# Patient Record
Sex: Male | Born: 1980 | Race: Black or African American | Hispanic: No | Marital: Single | Smoking: Never smoker
Health system: Southern US, Community
[De-identification: ages and names within clinical notes are randomized; demographics above are authoritative.]

## PROBLEM LIST (undated history)

## (undated) HISTORY — PX: ANTERIOR CRUCIATE LIGAMENT REPAIR: SHX115

---
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
## 2002-04-01 ENCOUNTER — Emergency Department (HOSPITAL_COMMUNITY): Admission: EM | Admit: 2002-04-01 | Discharge: 2002-04-02 | Payer: Self-pay | Admitting: Emergency Medicine

## 2002-07-06 ENCOUNTER — Emergency Department (HOSPITAL_COMMUNITY): Admission: EM | Admit: 2002-07-06 | Discharge: 2002-07-06 | Payer: Self-pay | Admitting: Emergency Medicine

## 2002-07-06 ENCOUNTER — Encounter: Payer: Self-pay | Admitting: Emergency Medicine

## 2003-03-11 ENCOUNTER — Emergency Department (HOSPITAL_COMMUNITY): Admission: EM | Admit: 2003-03-11 | Discharge: 2003-03-11 | Payer: Self-pay | Admitting: Emergency Medicine

## 2003-04-11 ENCOUNTER — Emergency Department (HOSPITAL_COMMUNITY): Admission: EM | Admit: 2003-04-11 | Discharge: 2003-04-11 | Payer: Self-pay | Admitting: Emergency Medicine

## 2003-04-29 ENCOUNTER — Emergency Department (HOSPITAL_COMMUNITY): Admission: EM | Admit: 2003-04-29 | Discharge: 2003-04-29 | Payer: Self-pay | Admitting: Emergency Medicine

## 2003-10-31 ENCOUNTER — Emergency Department (HOSPITAL_COMMUNITY): Admission: EM | Admit: 2003-10-31 | Discharge: 2003-10-31 | Payer: Self-pay | Admitting: Emergency Medicine

## 2006-08-23 ENCOUNTER — Emergency Department (HOSPITAL_COMMUNITY): Admission: EM | Admit: 2006-08-23 | Discharge: 2006-08-23 | Payer: Self-pay | Admitting: Emergency Medicine

## 2010-05-21 ENCOUNTER — Emergency Department (HOSPITAL_COMMUNITY)
Admission: EM | Admit: 2010-05-21 | Discharge: 2010-05-21 | Payer: Self-pay | Source: Home / Self Care | Admitting: Emergency Medicine

## 2010-05-21 LAB — RAPID STREP SCREEN (MED CTR MEBANE ONLY): Streptococcus, Group A Screen (Direct): NEGATIVE

## 2010-05-22 LAB — STREP A DNA PROBE

## 2010-09-06 ENCOUNTER — Emergency Department (HOSPITAL_COMMUNITY)
Admission: EM | Admit: 2010-09-06 | Discharge: 2010-09-06 | Disposition: A | Discharge status: Home / Self Care | Payer: Managed Care, Other (non HMO) | Attending: Emergency Medicine | Admitting: Emergency Medicine

## 2010-09-06 DIAGNOSIS — R05 Cough: Secondary | ICD-10-CM | POA: Insufficient documentation

## 2010-09-06 DIAGNOSIS — R059 Cough, unspecified: Secondary | ICD-10-CM | POA: Insufficient documentation

## 2010-09-06 DIAGNOSIS — R509 Fever, unspecified: Secondary | ICD-10-CM | POA: Insufficient documentation

## 2010-09-06 DIAGNOSIS — J029 Acute pharyngitis, unspecified: Secondary | ICD-10-CM | POA: Insufficient documentation

## 2010-09-10 ENCOUNTER — Emergency Department (HOSPITAL_COMMUNITY)
Admission: EM | Admit: 2010-09-10 | Discharge: 2010-09-10 | Disposition: A | Discharge status: Home / Self Care | Payer: Managed Care, Other (non HMO) | Attending: Emergency Medicine | Admitting: Emergency Medicine

## 2010-09-10 DIAGNOSIS — R509 Fever, unspecified: Secondary | ICD-10-CM | POA: Insufficient documentation

## 2010-09-10 DIAGNOSIS — R599 Enlarged lymph nodes, unspecified: Secondary | ICD-10-CM | POA: Insufficient documentation

## 2010-09-10 DIAGNOSIS — J039 Acute tonsillitis, unspecified: Secondary | ICD-10-CM | POA: Insufficient documentation

## 2010-09-21 ENCOUNTER — Emergency Department (HOSPITAL_COMMUNITY)
Admission: EM | Admit: 2010-09-21 | Discharge: 2010-09-21 | Disposition: A | Discharge status: Home / Self Care | Payer: Managed Care, Other (non HMO) | Attending: Emergency Medicine | Admitting: Emergency Medicine

## 2010-09-21 DIAGNOSIS — L03317 Cellulitis of buttock: Secondary | ICD-10-CM | POA: Insufficient documentation

## 2010-09-21 DIAGNOSIS — L0231 Cutaneous abscess of buttock: Secondary | ICD-10-CM | POA: Insufficient documentation

## 2010-09-23 ENCOUNTER — Emergency Department (HOSPITAL_COMMUNITY)
Admission: EM | Admit: 2010-09-23 | Discharge: 2010-09-23 | Disposition: A | Discharge status: Home / Self Care | Payer: Managed Care, Other (non HMO) | Attending: Emergency Medicine | Admitting: Emergency Medicine

## 2010-09-23 DIAGNOSIS — L0231 Cutaneous abscess of buttock: Secondary | ICD-10-CM | POA: Insufficient documentation

## 2010-09-23 DIAGNOSIS — L03317 Cellulitis of buttock: Secondary | ICD-10-CM | POA: Insufficient documentation

## 2010-09-23 DIAGNOSIS — Z09 Encounter for follow-up examination after completed treatment for conditions other than malignant neoplasm: Secondary | ICD-10-CM | POA: Insufficient documentation

## 2010-12-25 ENCOUNTER — Inpatient Hospital Stay (INDEPENDENT_AMBULATORY_CARE_PROVIDER_SITE_OTHER)
Admission: RE | Admit: 2010-12-25 | Discharge: 2010-12-25 | Disposition: A | Discharge status: Home / Self Care | Payer: Managed Care, Other (non HMO) | Source: Ambulatory Visit | Attending: Emergency Medicine | Admitting: Emergency Medicine

## 2010-12-25 DIAGNOSIS — J069 Acute upper respiratory infection, unspecified: Secondary | ICD-10-CM

## 2010-12-25 DIAGNOSIS — J029 Acute pharyngitis, unspecified: Secondary | ICD-10-CM

## 2011-03-21 ENCOUNTER — Ambulatory Visit
Admission: RE | Admit: 2011-03-21 | Discharge: 2011-03-21 | Disposition: A | Discharge status: Home / Self Care | Payer: Managed Care, Other (non HMO) | Source: Ambulatory Visit | Attending: Family Medicine | Admitting: Family Medicine

## 2011-03-21 ENCOUNTER — Other Ambulatory Visit: Payer: Self-pay | Admitting: Family Medicine

## 2011-03-21 DIAGNOSIS — R52 Pain, unspecified: Secondary | ICD-10-CM

## 2011-03-26 ENCOUNTER — Encounter: Payer: Self-pay | Admitting: *Deleted

## 2011-03-26 ENCOUNTER — Emergency Department (INDEPENDENT_AMBULATORY_CARE_PROVIDER_SITE_OTHER)
Admission: EM | Admit: 2011-03-26 | Discharge: 2011-03-26 | Disposition: A | Discharge status: Home / Self Care | Payer: Managed Care, Other (non HMO) | Source: Home / Self Care | Attending: Emergency Medicine | Admitting: Emergency Medicine

## 2011-03-26 DIAGNOSIS — J358 Other chronic diseases of tonsils and adenoids: Secondary | ICD-10-CM

## 2011-03-26 LAB — POCT RAPID STREP A: Streptococcus, Group A Screen (Direct): NEGATIVE

## 2011-03-26 MED ORDER — AMOXICILLIN 500 MG PO CAPS: 500.0000 mg | ORAL_CAPSULE | Freq: Three times a day (TID) | ORAL | Status: AC

## 2011-03-26 MED ORDER — GUAIFENESIN-CODEINE 100-10 MG/5ML PO SYRP: 5.0000 mL | ORAL_SOLUTION | Freq: Three times a day (TID) | ORAL | Status: AC | PRN

## 2011-03-26 NOTE — ED Provider Notes (Signed)
History     CSN: [id number] Arrival date & time: 03/26/2011 12:12 PM   First MD Initiated Contact with Patient 03/26/11 1139      Chief Complaint  Patient presents with  . Sore Throat    (Consider location/radiation/quality/duration/timing/severity/associated sxs/prior treatment) HPI Comments: Having a sore throat x 2 days, hurst to swallow: and my R ear hurts  Patient is a 30 y.o. male presenting with pharyngitis. The history is provided by the patient.  Sore Throat This is a new problem. The current episode started 2 days ago. The problem occurs constantly. Associated symptoms include headaches. Pertinent negatives include no shortness of breath. The symptoms are aggravated by swallowing and coughing. The symptoms are relieved by nothing. He has tried nothing for the symptoms. The treatment provided no relief.    History reviewed. No pertinent past medical history.  History reviewed. No pertinent past surgical history.  Family History  Problem Relation Age of Onset  . Hypertension Mother   . Cancer Father     History  Substance Use Topics  . Smoking status: Never Smoker   . Smokeless tobacco: Not on file  . Alcohol Use: Yes     Social      Review of Systems  Respiratory: Negative for shortness of breath.   Neurological: Positive for headaches.    Allergies  Review of patient's allergies indicates no known allergies.  Home Medications   Current Outpatient Rx  Name Route Sig Dispense Refill  . AMOXICILLIN 500 MG PO CAPS Oral Take 1 capsule (500 mg total) by mouth 3 (three) times daily. X 10 days 21 capsule 0  . GUAIFENESIN-CODEINE 100-10 MG/5ML PO SYRP Oral Take 5 mLs by mouth 3 (three) times daily as needed. 120 mL 0    BP 137/89  Pulse 109  Temp(Src) 102.7 F (39.3 C) (Oral)  Resp 20  SpO2 100%  Physical Exam  Nursing note and vitals reviewed. Constitutional: He appears well-developed. No distress.  HENT:  Head: Normocephalic.  Right Ear:  Tympanic membrane and ear canal normal.  Left Ear: Tympanic membrane and ear canal normal.  Mouth/Throat: Uvula is midline and mucous membranes are normal. Oropharyngeal exudate and posterior oropharyngeal erythema present. No posterior oropharyngeal edema or tonsillar abscesses.    Eyes: Conjunctivae are normal.  Neck: Normal range of motion. No JVD present. No tracheal deviation present.  Pulmonary/Chest: Effort normal and breath sounds normal. No respiratory distress. He has no wheezes. He has no rales.  Abdominal: Soft.  Musculoskeletal: Normal range of motion.  Lymphadenopathy:    He has cervical adenopathy.  Neurological: He is alert.  Skin: Skin is warm. No erythema.    ED Course  Procedures (including critical care time)   Labs Reviewed  POCT RAPID STREP A ([facility] ONLY)   No results found.   1. Tonsillar exudate       MDM  48 hrs. Localized tonsillitis wth and exudate. No evidence of an abscess. Follow-up guided to return in 48 hours for recheck, if worsening pain or no impoveme         Jimmie Molly, MD 03/26/11 [phone]

## 2011-03-26 NOTE — ED Notes (Signed)
Pt    Reports   sorethroat    Fever     vomitng     As   Well   As  Body   Aches       Stomach  Pain  Symptoms  X  3  Days

## 2014-12-27 ENCOUNTER — Encounter (HOSPITAL_COMMUNITY): Payer: Self-pay | Admitting: Emergency Medicine

## 2014-12-27 ENCOUNTER — Emergency Department (HOSPITAL_COMMUNITY)
Admission: EM | Admit: 2014-12-27 | Discharge: 2014-12-27 | Disposition: A | Discharge status: Home / Self Care | Payer: Managed Care, Other (non HMO) | Attending: Emergency Medicine | Admitting: Emergency Medicine

## 2014-12-27 ENCOUNTER — Emergency Department (HOSPITAL_COMMUNITY): Payer: Managed Care, Other (non HMO)

## 2014-12-27 DIAGNOSIS — Y9289 Other specified places as the place of occurrence of the external cause: Secondary | ICD-10-CM | POA: Insufficient documentation

## 2014-12-27 DIAGNOSIS — W25XXXA Contact with sharp glass, initial encounter: Secondary | ICD-10-CM | POA: Insufficient documentation

## 2014-12-27 DIAGNOSIS — S61411A Laceration without foreign body of right hand, initial encounter: Secondary | ICD-10-CM | POA: Insufficient documentation

## 2014-12-27 DIAGNOSIS — Y9389 Activity, other specified: Secondary | ICD-10-CM | POA: Diagnosis not present

## 2014-12-27 DIAGNOSIS — Y998 Other external cause status: Secondary | ICD-10-CM | POA: Insufficient documentation

## 2014-12-27 DIAGNOSIS — Z23 Encounter for immunization: Secondary | ICD-10-CM | POA: Insufficient documentation

## 2014-12-27 DIAGNOSIS — S6991XA Unspecified injury of right wrist, hand and finger(s), initial encounter: Secondary | ICD-10-CM

## 2014-12-27 MED ORDER — TETANUS-DIPHTH-ACELL PERTUSSIS 5-2.5-18.5 LF-MCG/0.5 IM SUSP
0.5000 mL | Freq: Once | INTRAMUSCULAR | Status: AC
  Administered 2014-12-27: 0.5 mL via INTRAMUSCULAR
  Filled 2014-12-27: qty 0.5

## 2014-12-27 MED ORDER — ACETAMINOPHEN 500 MG PO TABS
1000.0000 mg | ORAL_TABLET | Freq: Once | ORAL | Status: AC
  Administered 2014-12-27: 1000 mg via ORAL
  Filled 2014-12-27: qty 2

## 2014-12-27 NOTE — Discharge Instructions (Signed)

## 2014-12-27 NOTE — ED Provider Notes (Signed)
CSN: [id number]     Arrival date & time 12/27/14  4098 History   First MD Initiated Contact with Patient 12/27/14 [phone]     Chief Complaint  Patient presents with  . Laceration    The patient closed a door and his hand went through a window.  He advised me that the glass went through his hand and caused a laceration to the base of the right index finger.     (Consider location/radiation/quality/duration/timing/severity/associated sxs/prior Treatment) HPI Comments: Patient presents to the emergency department with chief complaint of right hand injury. Patient states that he pushed his hand through a pane of glass this morning. He has multiple scrapes on his hand and one small laceration. He also complains 1 puncture wound on the palm of his hand. He states that he removed a shard of glass from this wound. He states that his pain is worsened with palpation and movement. He has not taken anything to alleviate his pain. He denies any difficulty moving his fingers. Bleeding is controlled. Patient is not up-to-date on his tetanus shot. He rates his pain as an 8 out of 10.  The history is provided by the patient. No language interpreter was used.    History reviewed. No pertinent past medical history. Past Surgical History  Procedure Laterality Date  . Anterior cruciate ligament repair     Family History  Problem Relation Age of Onset  . Hypertension Mother   . Cancer Father    Social History  Substance Use Topics  . Smoking status: Never Smoker   . Smokeless tobacco: None  . Alcohol Use: Yes     Comment: Social    Review of Systems  Constitutional: Negative for fever and chills.  Respiratory: Negative for shortness of breath.   Cardiovascular: Negative for chest pain.  Gastrointestinal: Negative for nausea, vomiting, diarrhea and constipation.  Genitourinary: Negative for dysuria.  Skin: Positive for wound.  All other systems reviewed and are negative.     Allergies  Review of  patient's allergies indicates no known allergies.  Home Medications   Prior to Admission medications   Not on File   BP 142/89 mmHg  Temp(Src) 98.2 F (36.8 C) (Oral)  Resp 16  SpO2 99% Physical Exam  Constitutional: He is oriented to person, place, and time. He appears well-developed and well-nourished.  HENT:  Head: Normocephalic and atraumatic.  Eyes: Conjunctivae and EOM are normal.  Neck: Normal range of motion.  Cardiovascular: Normal rate and intact distal pulses.   Brisk capillary refill  Pulmonary/Chest: Effort normal.  Abdominal: He exhibits no distension.  Musculoskeletal: Normal range of motion.  Normal range of motion and strength of all digits and right hand, all joints were individually isolated  Neurological: He is alert and oriented to person, place, and time.  Sensation intact  Skin: Skin is dry.  Small 1 cm shallow laceration over the posterior lateral aspect of the right hand between the first and second metacarpal phalangeal joints, small puncture wound on palmar aspect of hand near the second MCP, bleeding is controlled  Psychiatric: He has a normal mood and affect. His behavior is normal. Judgment and thought content normal.  Nursing note and vitals reviewed.   ED Course  Procedures (including critical care time) Labs Review Labs Reviewed - No data to display  Imaging Review No results found. I have personally reviewed and evaluated these images and lab results as part of my medical decision-making.   EKG Interpretation None  LACERATION REPAIR Performed by: Roxy Horseman Authorized by: Roxy Horseman Consent: Verbal consent obtained. Risks and benefits: risks, benefits and alternatives were discussed Consent given by: patient Patient identity confirmed: provided demographic data Prepped and Draped in normal sterile fashion Wound explored  Laceration Location: Posterior right hand  Laceration Length: 1 cm  No Foreign Bodies seen  or palpated  Anesthesia: local infiltration  Local anesthetic: Not used   Irrigation method: syringe Amount of cleaning: standard  Skin closure: Dermabond   Patient tolerance: Patient tolerated the procedure well with no immediate complications.  MDM   Final diagnoses:  Hand injury, right, initial encounter    Patient with right hand injury. Will check plain films to rule out foreign body. No evidence of tendon or ligament injury. Small laceration will be repaired with Dermabond. Tetanus shot updated.    Roxy Horseman, PA-C 12/27/14 0840  Tilden Fossa, MD 12/27/14 1024

## 2014-12-27 NOTE — ED Notes (Signed)
Patient is resting comfortably. 

## 2014-12-27 NOTE — ED Notes (Signed)
Declined W/C at D/C and was escorted to lobby by RN. 

## 2014-12-27 NOTE — ED Notes (Signed)
The patient closed a door and his hand went through a window.  He advised me that the glass went through his hand and caused a laceration to the base of the right index finger.  The patient rates his pain 8/10.

## 2015-07-07 ENCOUNTER — Other Ambulatory Visit: Payer: Self-pay | Admitting: Orthopedic Surgery

## 2015-07-07 DIAGNOSIS — M25562 Pain in left knee: Secondary | ICD-10-CM

## 2015-07-16 ENCOUNTER — Inpatient Hospital Stay: Admission: RE | Admit: 2015-07-16 | Payer: Managed Care, Other (non HMO) | Source: Ambulatory Visit

## 2017-03-25 ENCOUNTER — Encounter (HOSPITAL_COMMUNITY): Payer: Self-pay | Admitting: Emergency Medicine

## 2017-03-25 ENCOUNTER — Other Ambulatory Visit: Payer: Self-pay

## 2017-03-25 DIAGNOSIS — J181 Lobar pneumonia, unspecified organism: Secondary | ICD-10-CM | POA: Insufficient documentation

## 2017-03-25 DIAGNOSIS — J029 Acute pharyngitis, unspecified: Secondary | ICD-10-CM | POA: Diagnosis present

## 2017-03-25 LAB — COMPREHENSIVE METABOLIC PANEL
ALBUMIN: 3.4 g/dL — AB (ref 3.5–5.0)
ALT: 62 U/L (ref 17–63)
ANION GAP: 10 (ref 5–15)
AST: 99 U/L — ABNORMAL HIGH (ref 15–41)
Alkaline Phosphatase: 71 U/L (ref 38–126)
BILIRUBIN TOTAL: 0.8 mg/dL (ref 0.3–1.2)
BUN: 7 mg/dL (ref 6–20)
CO2: 27 mmol/L (ref 22–32)
Calcium: 8.6 mg/dL — ABNORMAL LOW (ref 8.9–10.3)
Chloride: 95 mmol/L — ABNORMAL LOW (ref 101–111)
Creatinine, Ser: 1.14 mg/dL (ref 0.61–1.24)
GFR calc Af Amer: >60 mL/min (ref >60)
Glucose, Bld: 131 mg/dL — ABNORMAL HIGH (ref 65–99)
POTASSIUM: 3 mmol/L — AB (ref 3.5–5.1)
Sodium: 132 mmol/L — ABNORMAL LOW (ref 135–145)
TOTAL PROTEIN: 8.4 g/dL — AB (ref 6.5–8.1)

## 2017-03-25 LAB — URINALYSIS, ROUTINE W REFLEX MICROSCOPIC
BACTERIA UA: NONE SEEN
BILIRUBIN URINE: NEGATIVE
Glucose, UA: NEGATIVE mg/dL
Ketones, ur: NEGATIVE mg/dL
LEUKOCYTES UA: NEGATIVE
NITRITE: NEGATIVE
PROTEIN: 100 mg/dL — AB
Specific Gravity, Urine: 1.016 (ref 1.005–1.030)
pH: 5 (ref 5.0–8.0)

## 2017-03-25 LAB — CBC
HEMATOCRIT: 44.7 % (ref 39.0–52.0)
HEMOGLOBIN: 15.3 g/dL (ref 13.0–17.0)
MCH: 31.3 pg (ref 26.0–34.0)
MCHC: 34.2 g/dL (ref 30.0–36.0)
MCV: 91.4 fL (ref 78.0–100.0)
Platelets: 236 10*3/uL (ref 150–400)
RBC: 4.89 MIL/uL (ref 4.22–5.81)
RDW: 13.2 % (ref 11.5–15.5)
WBC: 11.6 10*3/uL — AB (ref 4.0–10.5)

## 2017-03-25 LAB — LIPASE, BLOOD: Lipase: 22 U/L (ref 11–51)

## 2017-03-25 LAB — RAPID STREP SCREEN (MED CTR MEBANE ONLY): STREPTOCOCCUS, GROUP A SCREEN (DIRECT): NEGATIVE

## 2017-03-25 MED ORDER — ACETAMINOPHEN 325 MG PO TABS
650.0000 mg | ORAL_TABLET | Freq: Once | ORAL | Status: AC | PRN
  Administered 2017-03-26: 650 mg via ORAL
  Filled 2017-03-25 (×2): qty 2

## 2017-03-25 NOTE — ED Triage Notes (Signed)
Pt to ER for evaluation of 1 week hx of sore throat and fever with swollen, red tonsils. Pt reports 3 days ago began vomiting and 2 days ago began having CP. HR 125-130 in triage. Pt states sharp chest pains. Pt a/o x4. NAD

## 2017-03-26 ENCOUNTER — Emergency Department (HOSPITAL_COMMUNITY): Payer: Managed Care, Other (non HMO)

## 2017-03-26 ENCOUNTER — Emergency Department (HOSPITAL_COMMUNITY)
Admission: EM | Admit: 2017-03-26 | Discharge: 2017-03-26 | Disposition: A | Discharge status: Home / Self Care | Payer: Managed Care, Other (non HMO) | Attending: Emergency Medicine | Admitting: Emergency Medicine

## 2017-03-26 DIAGNOSIS — J181 Lobar pneumonia, unspecified organism: Secondary | ICD-10-CM

## 2017-03-26 DIAGNOSIS — R6889 Other general symptoms and signs: Secondary | ICD-10-CM

## 2017-03-26 DIAGNOSIS — J189 Pneumonia, unspecified organism: Secondary | ICD-10-CM

## 2017-03-26 MED ORDER — IBUPROFEN 600 MG PO TABS: 600.0000 mg | ORAL_TABLET | Freq: Four times a day (QID) | ORAL | 0 refills | Status: DC | PRN

## 2017-03-26 MED ORDER — SODIUM CHLORIDE 0.9 % IV BOLUS (SEPSIS)
1000.0000 mL | Freq: Once | INTRAVENOUS | Status: AC
  Administered 2017-03-26: 1000 mL via INTRAVENOUS

## 2017-03-26 MED ORDER — ONDANSETRON 4 MG PO TBDP: 4.0000 mg | ORAL_TABLET | Freq: Three times a day (TID) | ORAL | 0 refills | Status: DC | PRN

## 2017-03-26 MED ORDER — LEVOFLOXACIN 500 MG PO TABS: 500.0000 mg | ORAL_TABLET | Freq: Every day | ORAL | 0 refills | Status: AC

## 2017-03-26 MED ORDER — ONDANSETRON HCL 4 MG/2ML IJ SOLN
4.0000 mg | Freq: Once | INTRAMUSCULAR | Status: AC
  Administered 2017-03-26: 4 mg via INTRAVENOUS
  Filled 2017-03-26: qty 2

## 2017-03-26 MED ORDER — IBUPROFEN 400 MG PO TABS
600.0000 mg | ORAL_TABLET | Freq: Once | ORAL | Status: AC
  Administered 2017-03-26: 07:00:00 600 mg via ORAL
  Filled 2017-03-26: qty 1

## 2017-03-26 MED ORDER — LEVOFLOXACIN 500 MG PO TABS
500.0000 mg | ORAL_TABLET | Freq: Once | ORAL | Status: AC
  Administered 2017-03-26: 500 mg via ORAL
  Filled 2017-03-26: qty 1

## 2017-03-26 NOTE — ED Provider Notes (Signed)
[facility] Provider Note   CSN: [id number] Arrival date & time: 03/25/17  1841     History   Chief Complaint Chief Complaint  Patient presents with  . Sore Throat  . Chest Pain    HPI Jared Barnett is a 36 y.o. male.  HPI  This is a 36 year old male who presents with several day history of sore throat, fever, nausea, vomiting, chest pain.  Patient reports fevers at home.  States that he was seen by "Dr." and given medication.  He is unclear what this medication was.  He states "it did not help."  He reports persistent nonbloody, nonbilious emesis.  Reports crampy abdominal pain, myalgias and diarrhea.  Also reports sore throat.  Denies any headache.  States of the last 2 days he has had increasing pain in his chest.  He has had a cough.  It is nonproductive.  He is otherwise healthy.  History reviewed. No pertinent past medical history.  There are no active problems to display for this patient.   Past Surgical History:  Procedure Laterality Date  . ANTERIOR CRUCIATE LIGAMENT REPAIR         Home Medications    Prior to Admission medications   Medication Sig Start Date End Date Taking? Authorizing Provider  ibuprofen (ADVIL,MOTRIN) 600 MG tablet Take 1 tablet (600 mg total) by mouth every 6 (six) hours as needed. 03/26/17   Chino Sardo, Mayer Maskerourtney F, MD  levofloxacin (LEVAQUIN) 500 MG tablet Take 1 tablet (500 mg total) by mouth daily for 7 days. 03/26/17 04/02/17  Laiyla Slagel, Mayer Maskerourtney F, MD  ondansetron (ZOFRAN ODT) 4 MG disintegrating tablet Take 1 tablet (4 mg total) by mouth every 8 (eight) hours as needed for nausea or vomiting. 03/26/17   Kamryn Gauthier, Mayer Maskerourtney F, MD    Family History Family History  Problem Relation Age of Onset  . Hypertension Mother   . Cancer Father     Social History Social History   Tobacco Use  . Smoking status: Never Smoker  . Smokeless tobacco: Never Used  Substance Use Topics  . Alcohol use: Yes   Comment: Social  . Drug use: Yes    Frequency: 3.0 times per week     Allergies   Patient has no known allergies.   Review of Systems Review of Systems  Constitutional: Positive for chills and fever.  HENT: Positive for sore throat.   Respiratory: Positive for cough.   Cardiovascular: Positive for chest pain. Negative for leg swelling.  Gastrointestinal: Positive for diarrhea, nausea and vomiting.  Genitourinary: Negative for dysuria.  Musculoskeletal: Negative for neck stiffness.  Neurological: Negative for headaches.  All other systems reviewed and are negative.    Physical Exam Updated Vital Signs BP (!) 137/94 (BP Location: Left Arm)   Pulse 89   Temp (!) 101 F (38.3 C) (Oral)   Resp 16   SpO2 100%   Physical Exam  Constitutional: He is oriented to person, place, and time. He appears well-developed and well-nourished.  Overweight, no acute distress  HENT:  Head: Normocephalic and atraumatic.  Mouth/Throat: Uvula is midline. Oropharyngeal exudate present.  Bilateral tonsillar swelling with exudate  Eyes: Pupils are equal, round, and reactive to light.  Neck: Neck supple.  Cardiovascular: Regular rhythm and normal heart sounds.  No murmur heard. Tachycardia  Pulmonary/Chest: Effort normal and breath sounds normal. No respiratory distress. He has no wheezes.  Abdominal: Soft. Bowel sounds are normal. There is no tenderness. There is no  rebound.  Musculoskeletal: He exhibits no edema.  Lymphadenopathy:    He has no cervical adenopathy.  Neurological: He is alert and oriented to person, place, and time.  Skin: Skin is warm and dry.  Psychiatric: He has a normal mood and affect.  Nursing note and vitals reviewed.    ED Treatments / Results  Labs (all labs ordered are listed, but only abnormal results are displayed) Labs Reviewed  COMPREHENSIVE METABOLIC PANEL - Abnormal; Notable for the following components:      Result Value   Sodium 132 (*)     Potassium 3.0 (*)    Chloride 95 (*)    Glucose, Bld 131 (*)    Calcium 8.6 (*)    Total Protein 8.4 (*)    Albumin 3.4 (*)    AST 99 (*)    All other components within normal limits  CBC - Abnormal; Notable for the following components:   WBC 11.6 (*)    All other components within normal limits  URINALYSIS, ROUTINE W REFLEX MICROSCOPIC - Abnormal; Notable for the following components:   Color, Urine AMBER (*)    APPearance HAZY (*)    Hgb urine dipstick MODERATE (*)    Protein, ur 100 (*)    Squamous Epithelial / LPF 0-5 (*)    All other components within normal limits  RAPID STREP SCREEN (NOT AT [facility])  CULTURE, GROUP A STREP [facility])  LIPASE, BLOOD    EKG  EKG Interpretation  Date/Time:  Monday March 25 2017 18:59:26 EST Ventricular Rate:  125 PR Interval:  130 QRS Duration: 82 QT Interval:  298 QTC Calculation: 430 R Axis:   111 Text Interpretation:  Sinus tachycardia Left posterior fascicular block T wave abnormality, consider inferior ischemia Abnormal ECG Confirmed by Ross Marcus [id number]) on 03/26/2017 5:39:19 AM Also confirmed by Ross Marcus ([id number]), editor Elita Quick ([id number])  on 03/26/2017 7:21:14 AM       Radiology Dg Chest 2 View  Result Date: 03/26/2017 CLINICAL DATA:  Fever, upper respiratory infection symptoms for the past 4 days. Nonsmoker. EXAM: CHEST  2 VIEW COMPARISON:  Chest x-ray of October 31, 2003 FINDINGS: The lungs are adequately inflated. The lung markings are coarse in the retrocardiac region likely on the left. The heart and pulmonary vascularity are normal. The mediastinum is normal in width. There is no pleural effusion. The bony thorax is unremarkable. IMPRESSION: Subsegmental atelectasis or early pneumonia in the left lower lobe posteriorly. Follow-up radiographs are recommended if the patient's symptoms do not completely resolve following anticipated antibiotic therapy. Electronically Signed   By: David  [place] M.D.   On:  03/26/2017 07:01    Procedures Procedures (including critical care time)  Medications Ordered in ED Medications  acetaminophen (TYLENOL) tablet 650 mg (650 mg Oral Given 03/26/17 0249)  sodium chloride 0.9 % bolus 1,000 mL (1,000 mLs Intravenous New Bag/Given 03/26/17 0639)  ibuprofen (ADVIL,MOTRIN) tablet 600 mg (600 mg Oral Given 03/26/17 0641)  ondansetron (ZOFRAN) injection 4 mg (4 mg Intravenous Given 03/26/17 0641)  levofloxacin (LEVAQUIN) tablet 500 mg (500 mg Oral Given 03/26/17 0740)     Initial Impression / Assessment and Plan / ED Course  I have reviewed the triage vital signs and the nursing notes.  Pertinent labs & imaging results that were available during my care of the patient were reviewed by me and considered in my medical decision making (see chart for details).     Patient presents with several day history of flulike symptoms.  Febrile on exam.  Mildly tachycardic.  Otherwise nontoxic-appearing.  He does have oropharyngeal exudate.  No evidence of deep space infection.  Strep screen is negative.  Lab work obtained.  Mild hypokalemia and hyponatremia.  Patient given fluids, Zofran.  Abdominal exam is benign.  Suspect viral etiology.  7:45 AM X-ray shows evidence of likely early pneumonia in the left lower lobe.  Given additional systemic symptoms, would be concerned for influenza-like symptoms even though initial testing was negative at outside facility.  These tests are known to not be very sensitive.  Will treat with Levaquin for this reason given concern for flu.  Otherwise symptom control with Zofran, fluids, and ibuprofen.  Patient was given strict return precautions.  Final Clinical Impressions(s) / ED Diagnoses   Final diagnoses:  Flu-like symptoms  Community acquired pneumonia of left lower lobe of lung [facility](HCC)    ED Discharge Orders        Ordered    ondansetron (ZOFRAN ODT) 4 MG disintegrating tablet  Every 8 hours PRN     03/26/17 0744    ibuprofen  (ADVIL,MOTRIN) 600 MG tablet  Every 6 hours PRN     03/26/17 0744    levofloxacin (LEVAQUIN) 500 MG tablet  Daily     03/26/17 0744       Shon BatonHorton, Damani Rando F, MD 03/26/17 [phone]

## 2017-03-26 NOTE — ED Notes (Signed)
To x-ray

## 2017-03-26 NOTE — ED Notes (Signed)
No answer for vitals  

## 2017-03-26 NOTE — Discharge Instructions (Signed)
He was seen today for multiple upper respiratory symptoms and vomiting.  You have a flulike illness but also have what appears to be early pneumonia.  He will be treated with antibiotics.  Take Zofran as needed for vomiting.  Make sure to stay very well-hydrated.  Ibuprofen as needed for fevers.

## 2017-03-28 LAB — CULTURE, GROUP A STREP (THRC)

## 2018-06-22 ENCOUNTER — Other Ambulatory Visit: Payer: Self-pay

## 2018-06-22 ENCOUNTER — Ambulatory Visit (HOSPITAL_COMMUNITY)
Admission: EM | Admit: 2018-06-22 | Discharge: 2018-06-22 | Disposition: A | Discharge status: Home / Self Care | Payer: Managed Care, Other (non HMO) | Attending: Family Medicine | Admitting: Family Medicine

## 2018-06-22 ENCOUNTER — Encounter (HOSPITAL_COMMUNITY): Payer: Self-pay | Admitting: *Deleted

## 2018-06-22 DIAGNOSIS — J22 Unspecified acute lower respiratory infection: Secondary | ICD-10-CM

## 2018-06-22 MED ORDER — AMOXICILLIN-POT CLAVULANATE 875-125 MG PO TABS: 1.0000 | ORAL_TABLET | Freq: Two times a day (BID) | ORAL | 0 refills | Status: DC

## 2018-06-22 MED ORDER — DM-GUAIFENESIN ER 30-600 MG PO TB12: 1.0000 | ORAL_TABLET | Freq: Two times a day (BID) | ORAL | 0 refills | Status: DC

## 2018-06-22 MED ORDER — BENZONATATE 100 MG PO CAPS: 100.0000 mg | ORAL_CAPSULE | Freq: Three times a day (TID) | ORAL | 0 refills | Status: DC

## 2018-06-22 NOTE — Discharge Instructions (Addendum)
Get plenty rest Push fluids Take the antibiotic 2 times a day Take both Tessalon pills and Mucinex DM pills every 12 hours This is for the cough and congestion expect improvement in a few days

## 2018-06-22 NOTE — ED Triage Notes (Signed)
C/O cough and congestion without fever x 1 wk.

## 2018-06-22 NOTE — ED Provider Notes (Signed)
[facility]    CSN: [id number] Arrival date & time: 06/22/18  1724     History   Chief Complaint Chief Complaint  Patient presents with  . Cough    HPI Jared Barnett is a 38 y.o. male.   HPI Patient is here for cough.  He has a lot of chest congestion.  He is producing a lot of sputum.  He is very tired.  He states that he had a cough with fatigue about a year ago and was diagnosed with pneumonia. He has had current symptoms for 8 days.  He is tried multiple over-the-counter medicines without improvement.  He states he has had some sweats and chills low-grade fever.  Initially had some minor runny nose with this is gotten better.  No underlying lung disease, COPD, asthma History reviewed. No pertinent past medical history.  There are no active problems to display for this patient.   Past Surgical History:  Procedure Laterality Date  . ANTERIOR CRUCIATE LIGAMENT REPAIR         Home Medications    Prior to Admission medications   Medication Sig Start Date End Date Taking? Authorizing Provider  amoxicillin-clavulanate (AUGMENTIN) 875-125 MG tablet Take 1 tablet by mouth every 12 (twelve) hours. 06/22/18   Eustace Moore, MD  benzonatate (TESSALON) 100 MG capsule Take 1 capsule (100 mg total) by mouth every 8 (eight) hours. 06/22/18   Eustace Moore, MD  dextromethorphan-guaiFENesin [facility] DM) 30-600 MG 12hr tablet Take 1 tablet by mouth 2 (two) times daily. 06/22/18   Eustace Moore, MD    Family History Family History  Problem Relation Age of Onset  . Hypertension Mother   . Cancer Father     Social History Social History   Tobacco Use  . Smoking status: Never Smoker  . Smokeless tobacco: Never Used  Substance Use Topics  . Alcohol use: Yes    Comment: Social  . Drug use: Not Currently     Allergies   Patient has no known allergies.   Review of Systems Review of Systems  Constitutional: Positive for fatigue. Negative for chills and  fever.  HENT: Negative for ear pain and sore throat.   Eyes: Negative for pain and visual disturbance.  Respiratory: Positive for cough. Negative for shortness of breath.   Cardiovascular: Negative for chest pain and palpitations.  Gastrointestinal: Negative for abdominal pain and vomiting.  Genitourinary: Negative for dysuria and hematuria.  Musculoskeletal: Negative for arthralgias and back pain.  Skin: Negative for color change and rash.  Neurological: Negative for seizures and syncope.  All other systems reviewed and are negative.    Physical Exam Triage Vital Signs ED Triage Vitals  Enc Vitals Group     BP 06/22/18 1733 (!) 144/100     Pulse Rate 06/22/18 1732 69     Resp 06/22/18 1732 18     Temp 06/22/18 1732 98.2 F (36.8 C)     Temp Source 06/22/18 1732 Temporal     SpO2 06/22/18 1732 100 %     Weight --      Height --      Head Circumference --      Peak Flow --      Pain Score 06/22/18 1732 4     Pain Loc --      Pain Edu? --      Excl. in GC? --    No data found.  Updated Vital Signs BP (!) 144/100  Pulse 69   Temp 98.2 F (36.8 C) (Temporal)   Resp 18   SpO2 100%      Physical Exam Constitutional:      General: He is not in acute distress.    Appearance: He is well-developed. He is obese.  HENT:     Head: Normocephalic and atraumatic.     Right Ear: Tympanic membrane and ear canal normal.     Left Ear: Tympanic membrane and ear canal normal.     Nose: No rhinorrhea.     Mouth/Throat:     Mouth: Mucous membranes are moist.     Pharynx: No posterior oropharyngeal erythema.  Eyes:     Conjunctiva/sclera: Conjunctivae normal.     Pupils: Pupils are equal, round, and reactive to light.  Neck:     Musculoskeletal: Normal range of motion.  Cardiovascular:     Rate and Rhythm: Normal rate and regular rhythm.     Heart sounds: Normal heart sounds.  Pulmonary:     Effort: Pulmonary effort is normal. No respiratory distress.     Breath sounds:  Rhonchi present.  Abdominal:     General: There is no distension.     Palpations: Abdomen is soft.  Musculoskeletal: Normal range of motion.  Skin:    General: Skin is warm and dry.  Neurological:     Mental Status: He is alert.  Psychiatric:        Mood and Affect: Mood normal.        Behavior: Behavior normal.    Although this likely started off as a viral infection, after 8 days of symptoms, getting worse I do have concern about bacterial overgrowth.  He did show a tendency towards pneumonia last year.  Going to cover him with antibiotics, manage the cough, give him a couple days off work to rest.  He will return if he fails to improve  UC Treatments / Results  Labs (all labs ordered are listed, but only abnormal results are displayed) Labs Reviewed - No data to display  EKG None  Radiology No results found.  Procedures Procedures (including critical care time)  Medications Ordered in UC Medications - No data to display  Initial Impression / Assessment and Plan / UC Course  I have reviewed the triage vital signs and the nursing notes.  Pertinent labs & imaging results that were available during my care of the patient were reviewed by me and considered in my medical decision making (see chart for details).      Final Clinical Impressions(s) / UC Diagnoses   Final diagnoses:  LRTI (lower respiratory tract infection)     Discharge Instructions     Get plenty rest Push fluids Take the antibiotic 2 times a day Take both Tessalon pills and Mucinex DM pills every 12 hours This is for the cough and congestion expect improvement in a few days   ED Prescriptions    Medication Sig Dispense Auth. Provider   amoxicillin-clavulanate (AUGMENTIN) 875-125 MG tablet Take 1 tablet by mouth every 12 (twelve) hours. 14 tablet Eustace Moore, MD   benzonatate (TESSALON) 100 MG capsule Take 1 capsule (100 mg total) by mouth every 8 (eight) hours. 21 capsule Eustace Moore, MD   dextromethorphan-guaiFENesin [facility] DM) 30-600 MG 12hr tablet Take 1 tablet by mouth 2 (two) times daily. 20 tablet Eustace Moore, MD     Controlled Substance Prescriptions [place] Controlled Substance Registry consulted? Not Applicable   Eustace Moore, MD  06/22/18 1757  

## 2018-08-15 ENCOUNTER — Ambulatory Visit (INDEPENDENT_AMBULATORY_CARE_PROVIDER_SITE_OTHER): Payer: Managed Care, Other (non HMO)

## 2018-08-15 ENCOUNTER — Encounter: Payer: Self-pay | Admitting: Podiatry

## 2018-08-15 ENCOUNTER — Other Ambulatory Visit: Payer: Self-pay

## 2018-08-15 ENCOUNTER — Other Ambulatory Visit: Payer: Self-pay | Admitting: Podiatry

## 2018-08-15 ENCOUNTER — Ambulatory Visit: Payer: Managed Care, Other (non HMO) | Admitting: Podiatry

## 2018-08-15 VITALS — BP 148/97 | HR 78 | Temp 96.4°F | Resp 16

## 2018-08-15 DIAGNOSIS — M722 Plantar fascial fibromatosis: Secondary | ICD-10-CM

## 2018-08-15 DIAGNOSIS — M79672 Pain in left foot: Secondary | ICD-10-CM | POA: Diagnosis not present

## 2018-08-15 MED ORDER — TRIAMCINOLONE ACETONIDE 10 MG/ML IJ SUSP
10.0000 mg | Freq: Once | INTRAMUSCULAR | Status: AC
  Administered 2018-08-15: 10 mg

## 2018-08-15 NOTE — Progress Notes (Signed)
Subjective:   Patient ID: Jared Barnett, male   DOB: 38 y.o.   MRN: [id number]   HPI Patient presents with pain in the left arch stating that he has had it for years but is gotten worse again recently and has had orthotics that are beginning to wear out and do not hold his arch is up as well.  States is gotten worse recently patient does not smoke likes to be active   Review of Systems  All other systems reviewed and are negative.       Objective:  Physical Exam Vitals signs and nursing note reviewed.  Constitutional:      Appearance: He is well-developed.  Pulmonary:     Effort: Pulmonary effort is normal.  Musculoskeletal: Normal range of motion.  Skin:    General: Skin is warm.  Neurological:     Mental Status: He is alert.     Neurovascular status found to be intact muscle strength adequate range of motion within normal limits with patient found to have quite a bit of discomfort in the left mid arch area with inflammation fluid of the medial band of the fascia.  Patient is noted to have good digital perfusion well oriented x3 and there is a small nodule measuring about 1 cm x 1 cm of the fascia itself     Assessment:  Acute plantar fasciitis left with moderate depression of the arch obesity and also possibility of a plantar fibroma     Plan:  H&P condition reviewed at today I did sterile prep and did careful injection of the fascia in the mid arch 3 mg Kenalog followed up Xylocaine discussed long-term new orthotics will see him back again in 1 week to decide and did give him fascial braces bilateral to hold up his arches appropriately and he will be seen back to reevaluate  X-ray indicates moderate depression the arch with no indication of spur formation or other bony pathology

## 2018-08-15 NOTE — Progress Notes (Signed)
   Subjective:    Patient ID: Jared Barnett, male    DOB: February 09, 1981, 38 y.o.   MRN: [id number]  HPI    Review of Systems  All other systems reviewed and are negative.      Objective:   Physical Exam        Assessment & Plan:

## 2018-08-15 NOTE — Patient Instructions (Signed)

## 2018-08-29 ENCOUNTER — Ambulatory Visit: Payer: Managed Care, Other (non HMO) | Admitting: Podiatry

## 2018-11-05 ENCOUNTER — Other Ambulatory Visit: Payer: Self-pay | Admitting: Internal Medicine

## 2018-11-05 DIAGNOSIS — Z20822 Contact with and (suspected) exposure to covid-19: Secondary | ICD-10-CM

## 2018-11-08 LAB — NOVEL CORONAVIRUS, NAA: SARS-CoV-2, NAA: NOT DETECTED

## 2019-08-12 IMAGING — DX DG CHEST 2V
2 series · 2 of 2 positions shown · non-contrast
Comparison: Chest x-ray of October 31, 2003

CLINICAL DATA: Fever, upper respiratory infection symptoms for the
past 4 days. Nonsmoker.

EXAM:
CHEST  2 VIEW

[chest pa]
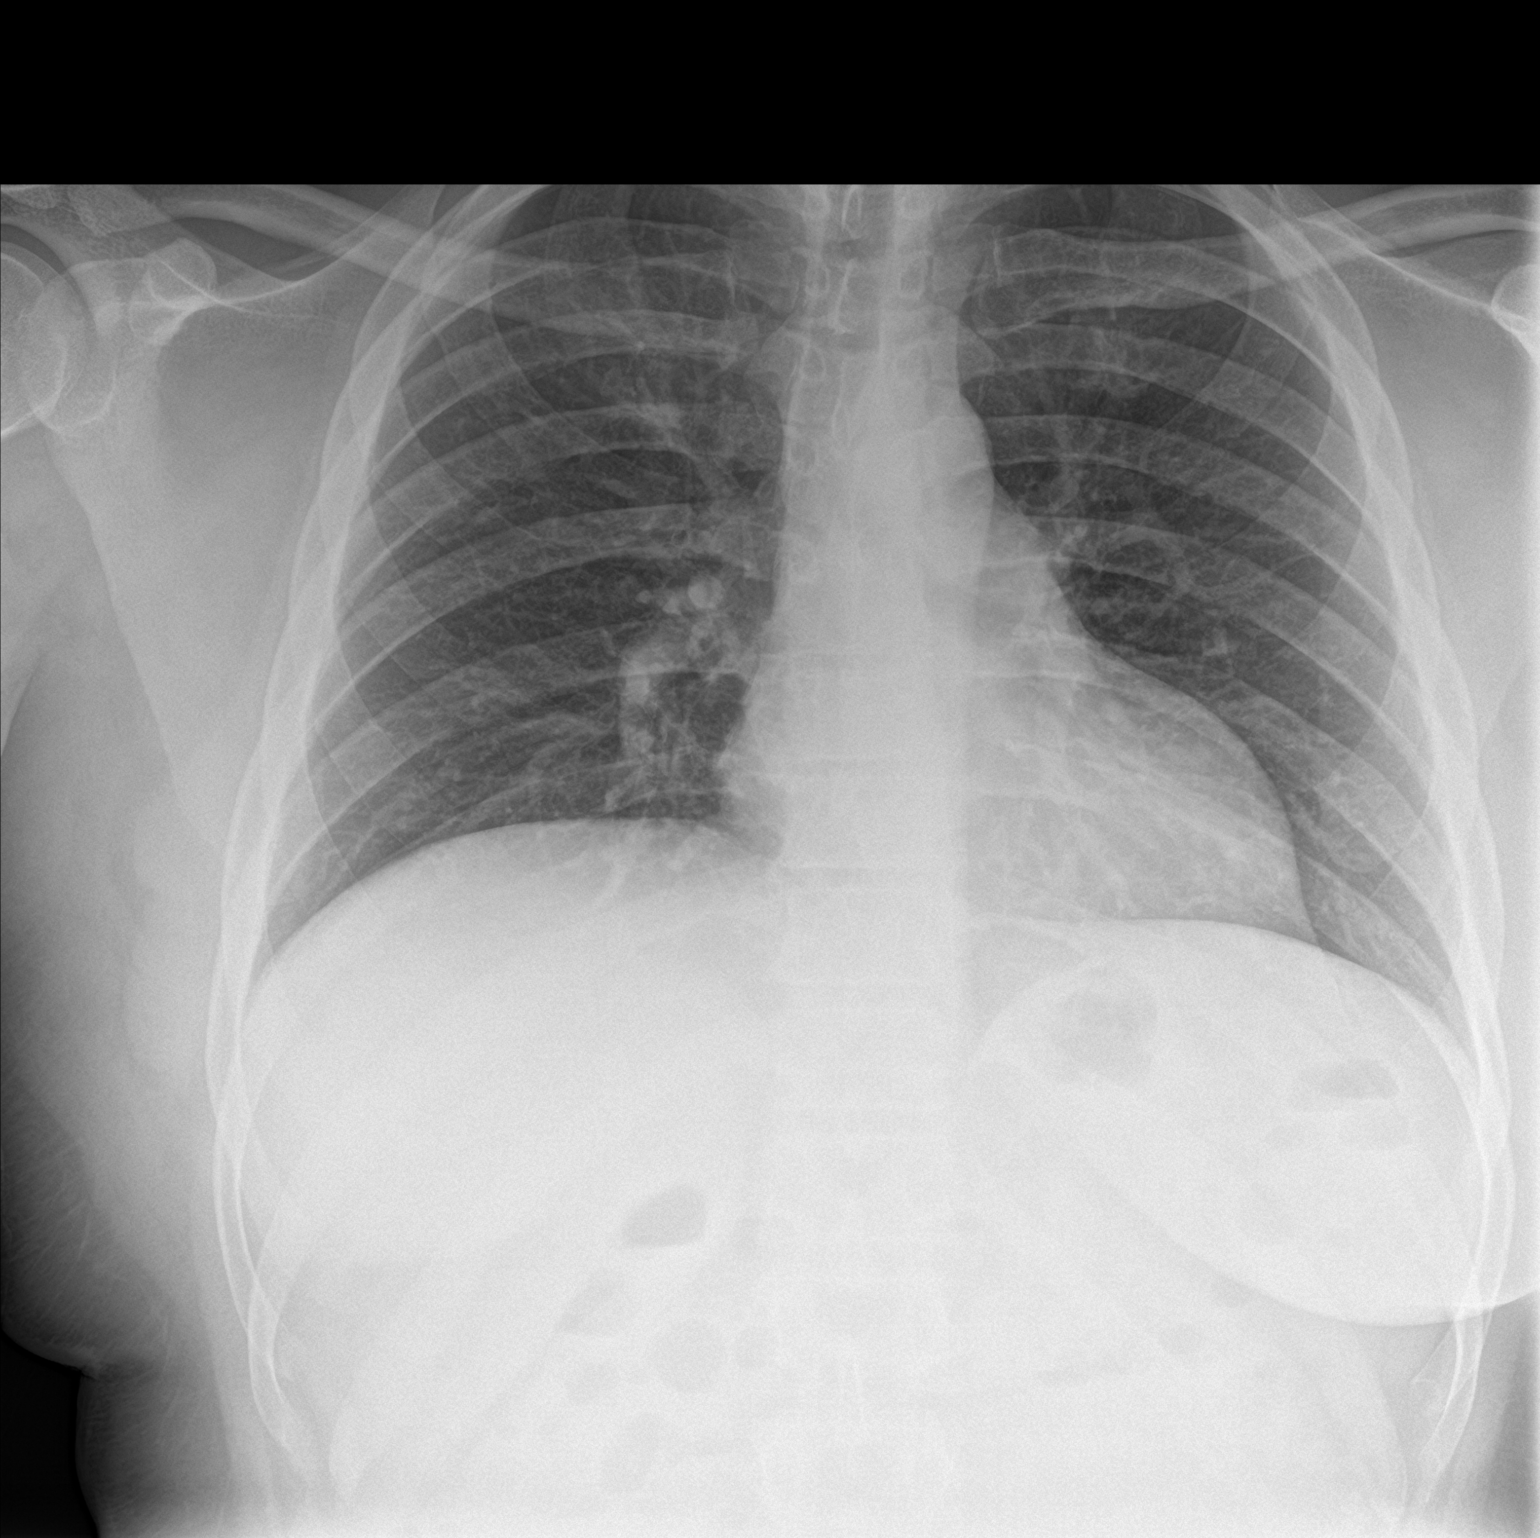

[chest lat]
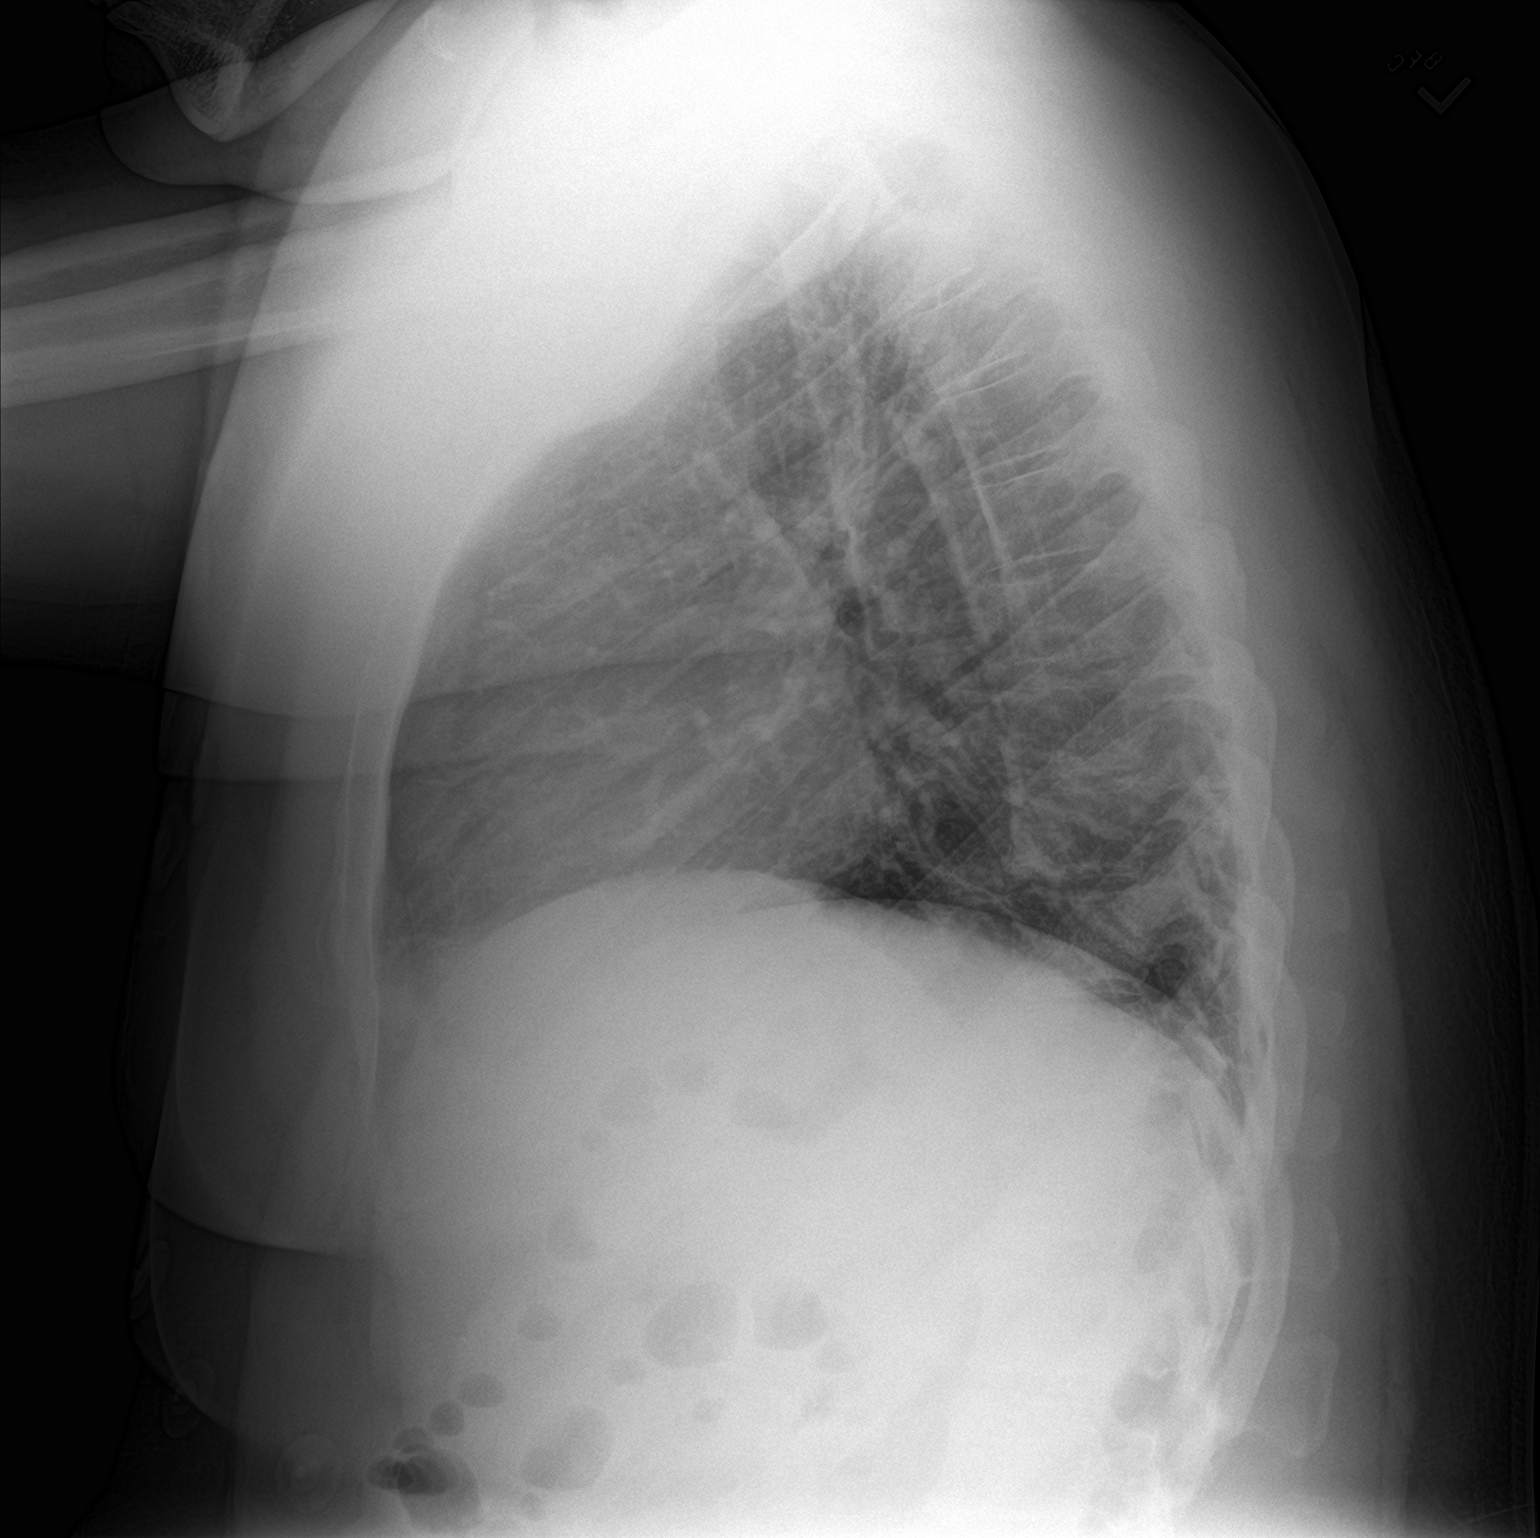

[2 of 2 positions shown; findings below may reference images not displayed]

FINDINGS: The lungs are adequately inflated. The lung markings are coarse in
the retrocardiac region likely on the left. The heart and pulmonary
vascularity are normal. The mediastinum is normal in width. There is
no pleural effusion. The bony thorax is unremarkable.
IMPRESSION: Subsegmental atelectasis or early pneumonia in the left lower lobe
posteriorly. Follow-up radiographs are recommended if the patient's
symptoms do not completely resolve following anticipated antibiotic
therapy.

## 2020-03-21 ENCOUNTER — Telehealth: Payer: Self-pay | Admitting: Podiatry

## 2020-03-21 NOTE — Telephone Encounter (Signed)
Pt called stating he has had his orthotics for years but has now misplaced the left one only and cannot walk without pain. Can he order just the left orthotic?

## 2021-04-23 ENCOUNTER — Other Ambulatory Visit: Payer: Self-pay

## 2021-04-23 ENCOUNTER — Ambulatory Visit (HOSPITAL_COMMUNITY)
Admission: EM | Admit: 2021-04-23 | Discharge: 2021-04-23 | Disposition: A | Discharge status: Home / Self Care | Payer: Managed Care, Other (non HMO) | Attending: Physician Assistant | Admitting: Physician Assistant

## 2021-04-23 ENCOUNTER — Encounter (HOSPITAL_COMMUNITY): Payer: Self-pay

## 2021-04-23 DIAGNOSIS — J40 Bronchitis, not specified as acute or chronic: Secondary | ICD-10-CM | POA: Diagnosis not present

## 2021-04-23 DIAGNOSIS — J329 Chronic sinusitis, unspecified: Secondary | ICD-10-CM | POA: Diagnosis not present

## 2021-04-23 DIAGNOSIS — R051 Acute cough: Secondary | ICD-10-CM

## 2021-04-23 MED ORDER — PROMETHAZINE-DM 6.25-15 MG/5ML PO SYRP: 5.0000 mL | ORAL_SOLUTION | Freq: Four times a day (QID) | ORAL | 0 refills | Status: DC | PRN

## 2021-04-23 MED ORDER — PREDNISONE 20 MG PO TABS: 40.0000 mg | ORAL_TABLET | Freq: Every day | ORAL | 0 refills | Status: AC

## 2021-04-23 MED ORDER — AMOXICILLIN-POT CLAVULANATE 875-125 MG PO TABS: 1.0000 | ORAL_TABLET | Freq: Two times a day (BID) | ORAL | 0 refills | Status: DC

## 2021-04-23 NOTE — Discharge Instructions (Signed)
Start Augmentin twice daily to cover for infection.  Use prednisone burst (40 mg for 4 days) to help with symptoms.  Do not take NSAIDs including aspirin, ibuprofen/Advil, naproxen/Aleve with this medication as it can cause stomach bleeding.  You can use Tylenol, Mucinex, Flonase for symptom relief.  Use Promethazine DM up to 4 times a day as needed for cough.  This will make you sleepy so do not drive or drink alcohol while taking it.  Make sure you rest and drink plenty of fluid.  If you have any worsening symptoms including high fever not responding to medication, chest pain, shortness of breath, worsening cough, nausea/vomiting interfering with oral intake you need to go to the emergency room.  If symptoms or not improving by next week follow-up with [place] or your primary care provider.

## 2021-04-23 NOTE — ED Provider Notes (Signed)
[facility]    CSN: [id number] Arrival date & time: 04/23/21  1454      History   Chief Complaint Chief Complaint  Patient presents with   Cough   Fever    HPI Jared Barnett is a 41 y.o. male.   Patient presents today with a weeklong history of URI symptoms that have worsened over the past several days.  He reports initially having fever and body aches but the symptoms have improved.  He continues to have significant nasal congestion, sore throat, drainage, severe cough, shortness of breath.  Denies any chest pain, nausea, vomiting.  He has had COVID-19 vaccine but has not had booster.  He does not receive annual influenza vaccine.  He did take 2 at home COVID test when symptoms began that were negative.  Reports sick contacts at work.  He has been taking multiple over-the-counter medications including multisymptom medication without improvement of symptoms.  He denies history of allergies, asthma, COPD, smoking.  Denies history of diabetes.  Denies any recent antibiotic use.   History reviewed. No pertinent past medical history.  There are no problems to display for this patient.   Past Surgical History:  Procedure Laterality Date   ANTERIOR CRUCIATE LIGAMENT REPAIR         Home Medications    Prior to Admission medications   Medication Sig Start Date End Date Taking? Authorizing Provider  amoxicillin-clavulanate (AUGMENTIN) 875-125 MG tablet Take 1 tablet by mouth every 12 (twelve) hours. 04/23/21  Yes Joevanni Roddey K, PA-C  predniSONE (DELTASONE) 20 MG tablet Take 2 tablets (40 mg total) by mouth daily for 4 days. 04/23/21 04/27/21 Yes Cecilee Rosner K, PA-C  promethazine-dextromethorphan (PROMETHAZINE-DM) 6.25-15 MG/5ML syrup Take 5 mLs by mouth 4 (four) times daily as needed for cough. 04/23/21  Yes Tavius Turgeon, Noberto Retort, PA-C    Family History Family History  Problem Relation Age of Onset   Hypertension Mother    Cancer Father     Social History Social History    Tobacco Use   Smoking status: Never   Smokeless tobacco: Never  Vaping Use   Vaping Use: Never used  Substance Use Topics   Alcohol use: Yes    Comment: Social   Drug use: Not Currently     Allergies   Patient has no known allergies.   Review of Systems Review of Systems  Constitutional:  Positive for activity change and fatigue. Negative for appetite change and fever (Improved).  HENT:  Positive for congestion, postnasal drip and sore throat. Negative for sneezing.   Respiratory:  Positive for cough and shortness of breath.   Cardiovascular:  Negative for chest pain.  Gastrointestinal:  Negative for abdominal pain, diarrhea, nausea and vomiting.  Musculoskeletal:  Negative for arthralgias and myalgias.  Neurological:  Positive for headaches. Negative for dizziness and light-headedness.    Physical Exam Triage Vital Signs ED Triage Vitals [04/23/21 1640]  Enc Vitals Group     BP (!) 156/101     Pulse Rate 68     Resp 16     Temp 98.4 F (36.9 C)     Temp Source Oral     SpO2 100 %     Weight      Height      Head Circumference      Peak Flow      Pain Score 0     Pain Loc      Pain Edu?  Excl. in GC?    No data found.  Updated Vital Signs BP (!) 156/101 (BP Location: Left Arm)    Pulse 68    Temp 98.4 F (36.9 C) (Oral)    Resp 16    SpO2 100%   Visual Acuity Right Eye Distance:   Left Eye Distance:   Bilateral Distance:    Right Eye Near:   Left Eye Near:    Bilateral Near:     Physical Exam Vitals reviewed.  Constitutional:      General: He is awake.     Appearance: Normal appearance. He is well-developed. He is not ill-appearing.     Comments: Very pleasant male appears stated age in no acute distress sitting comfortably on exam room table  HENT:     Head: Normocephalic and atraumatic.     Right Ear: Tympanic membrane, ear canal and external ear normal. Tympanic membrane is not erythematous or bulging.     Left Ear: Tympanic membrane,  ear canal and external ear normal. Tympanic membrane is not erythematous or bulging.     Nose:     Right Sinus: Maxillary sinus tenderness present.     Left Sinus: Maxillary sinus tenderness present.     Mouth/Throat:     Pharynx: Uvula midline. Posterior oropharyngeal erythema present. No oropharyngeal exudate or uvula swelling.  Cardiovascular:     Rate and Rhythm: Normal rate and regular rhythm.     Heart sounds: Normal heart sounds, S1 normal and S2 normal. No murmur heard. Pulmonary:     Effort: Pulmonary effort is normal. No accessory muscle usage or respiratory distress.     Breath sounds: Normal breath sounds. No stridor. No wheezing, rhonchi or rales.     Comments: Clear to auscultation bilaterally Abdominal:     General: Bowel sounds are normal.     Palpations: Abdomen is soft.     Tenderness: There is no abdominal tenderness.  Neurological:     Mental Status: He is alert.  Psychiatric:        Behavior: Behavior is cooperative.     UC Treatments / Results  Labs (all labs ordered are listed, but only abnormal results are displayed) Labs Reviewed - No data to display  EKG   Radiology No results found.  Procedures Procedures (including critical care time)  Medications Ordered in UC Medications - No data to display  Initial Impression / Assessment and Plan / UC Course  I have reviewed the triage vital signs and the nursing notes.  Pertinent labs & imaging results that were available during my care of the patient were reviewed by me and considered in my medical decision making (see chart for details).     No indication for viral testing given patient has been symptomatic for over a week and this would not change management.  Chest x-ray was deferred given normal lung sounds and pulse ox of 100%.  Concern for sinobronchitis given prolonged and worsening symptoms.  Patient was started on Augmentin twice daily for 7 days as well as prednisone burst (40 mg for 4  days).  He was instructed not to take NSAIDs with prednisone due to risk of GI bleeding.  Can use Mucinex, Flonase, Tylenol for symptom relief.  He was prescribed Promethazine DM for cough with instruction not to drive or drink alcohol while taking this medication as drowsiness is a common side effect.  Work note provided as requested.  Discussed that if he has any worsening symptoms including worsening cough,  high fever, nausea/vomiting interfering with oral intake, shortness of breath, chest pain he needs to go to the emergency room.  Strict return precautions given to which he expressed understanding  Final Clinical Impressions(s) / UC Diagnoses   Final diagnoses:  Sinobronchitis  Acute cough     Discharge Instructions      Start Augmentin twice daily to cover for infection.  Use prednisone burst (40 mg for 4 days) to help with symptoms.  Do not take NSAIDs including aspirin, ibuprofen/Advil, naproxen/Aleve with this medication as it can cause stomach bleeding.  You can use Tylenol, Mucinex, Flonase for symptom relief.  Use Promethazine DM up to 4 times a day as needed for cough.  This will make you sleepy so do not drive or drink alcohol while taking it.  Make sure you rest and drink plenty of fluid.  If you have any worsening symptoms including high fever not responding to medication, chest pain, shortness of breath, worsening cough, nausea/vomiting interfering with oral intake you need to go to the emergency room.  If symptoms or not improving by next week follow-up with us or your primary care provider.     ED Prescriptions     Medication Sig Dispense Auth. Provider   amoxicillin-clavulanate (AUGMENTIN) 875-125 MG tablet Take 1 tablet by mouth every 12 (twelve) hours. 14 tablet Donice Alperin K, PA-C   predniSONE (DELTASONE) 20 MG tablet Take 2 tablets (40 mg total) by mouth daily for 4 days. 8 tablet Chrystel Barefield K, PA-C   promethazine-dextromethorphan (PROMETHAZINE-DM) 6.25-15 MG/5ML  syrup Take 5 mLs by mouth 4 (four) times daily as needed for cough. 118 mL Tammey Deeg K, PA-C      PDMP not reviewed this encounter.   Jeani HawkingRaspet, Adien Kimmel K, PA-C 04/23/21 1705

## 2021-04-23 NOTE — ED Triage Notes (Signed)
Pt presents to the office today for cough,congestion and fever for 4-5 days.He has taken two covid test with negative test results.

## 2021-10-15 ENCOUNTER — Ambulatory Visit (HOSPITAL_COMMUNITY)
Admission: EM | Admit: 2021-10-15 | Discharge: 2021-10-15 | Disposition: A | Discharge status: Home / Self Care | Payer: Managed Care, Other (non HMO) | Attending: Emergency Medicine | Admitting: Emergency Medicine

## 2021-10-15 ENCOUNTER — Encounter (HOSPITAL_COMMUNITY): Payer: Self-pay | Admitting: Emergency Medicine

## 2021-10-15 DIAGNOSIS — K219 Gastro-esophageal reflux disease without esophagitis: Secondary | ICD-10-CM | POA: Diagnosis not present

## 2021-10-15 MED ORDER — FAMOTIDINE 20 MG PO TABS: 20.0000 mg | ORAL_TABLET | Freq: Two times a day (BID) | ORAL | 0 refills | Status: DC

## 2021-10-15 MED ORDER — ONDANSETRON 4 MG PO TBDP: 4.0000 mg | ORAL_TABLET | Freq: Three times a day (TID) | ORAL | 0 refills | Status: DC | PRN

## 2021-10-15 MED ORDER — LIDOCAINE VISCOUS HCL 2 % MT SOLN
15.0000 mL | Freq: Once | OROMUCOSAL | Status: AC
  Administered 2021-10-15: 15 mL via ORAL

## 2021-10-15 MED ORDER — ALUM & MAG HYDROXIDE-SIMETH 200-200-20 MG/5ML PO SUSP
ORAL | Status: AC
  Filled 2021-10-15: qty 30

## 2021-10-15 MED ORDER — ALUM & MAG HYDROXIDE-SIMETH 200-200-20 MG/5ML PO SUSP
30.0000 mL | Freq: Once | ORAL | Status: AC
  Administered 2021-10-15: 30 mL via ORAL

## 2021-10-15 MED ORDER — LIDOCAINE VISCOUS HCL 2 % MT SOLN
OROMUCOSAL | Status: AC
  Filled 2021-10-15: qty 15

## 2022-03-22 ENCOUNTER — Other Ambulatory Visit: Payer: Self-pay | Admitting: Family Medicine

## 2022-03-22 DIAGNOSIS — R1013 Epigastric pain: Secondary | ICD-10-CM

## 2022-03-22 DIAGNOSIS — K580 Irritable bowel syndrome with diarrhea: Secondary | ICD-10-CM

## 2022-03-29 ENCOUNTER — Other Ambulatory Visit: Payer: Self-pay | Admitting: Family Medicine

## 2022-03-29 DIAGNOSIS — R1013 Epigastric pain: Secondary | ICD-10-CM

## 2022-03-29 DIAGNOSIS — K580 Irritable bowel syndrome with diarrhea: Secondary | ICD-10-CM

## 2022-07-09 ENCOUNTER — Other Ambulatory Visit: Payer: Self-pay

## 2022-07-09 ENCOUNTER — Emergency Department (HOSPITAL_COMMUNITY): Payer: Managed Care, Other (non HMO)

## 2022-07-09 ENCOUNTER — Emergency Department (HOSPITAL_COMMUNITY)
Admission: EM | Admit: 2022-07-09 | Discharge: 2022-07-10 | Disposition: A | Discharge status: Home / Self Care | Payer: Managed Care, Other (non HMO) | Attending: Emergency Medicine | Admitting: Emergency Medicine

## 2022-07-09 DIAGNOSIS — J209 Acute bronchitis, unspecified: Secondary | ICD-10-CM | POA: Diagnosis not present

## 2022-07-09 DIAGNOSIS — Z20822 Contact with and (suspected) exposure to covid-19: Secondary | ICD-10-CM | POA: Diagnosis not present

## 2022-07-09 DIAGNOSIS — R059 Cough, unspecified: Secondary | ICD-10-CM | POA: Diagnosis present

## 2022-07-09 LAB — CBC WITH DIFFERENTIAL/PLATELET
Abs Immature Granulocytes: 0.04 10*3/uL (ref 0.00–0.07)
Basophils Absolute: 0.1 10*3/uL (ref 0.0–0.1)
Basophils Relative: 1 %
Eosinophils Absolute: 0.1 10*3/uL (ref 0.0–0.5)
Eosinophils Relative: 2 %
HCT: 45.1 % (ref 39.0–52.0)
Hemoglobin: 15.2 g/dL (ref 13.0–17.0)
Immature Granulocytes: 0 %
Lymphocytes Relative: 27 %
Lymphs Abs: 2.4 10*3/uL (ref 0.7–4.0)
MCH: 31.4 pg (ref 26.0–34.0)
MCHC: 33.7 g/dL (ref 30.0–36.0)
MCV: 93.2 fL (ref 80.0–100.0)
Monocytes Absolute: 0.5 10*3/uL (ref 0.1–1.0)
Monocytes Relative: 5 %
Neutro Abs: 5.9 10*3/uL (ref 1.7–7.7)
Neutrophils Relative %: 65 %
Platelets: 296 10*3/uL (ref 150–400)
RBC: 4.84 MIL/uL (ref 4.22–5.81)
RDW: 12.2 % (ref 11.5–15.5)
WBC: 9 10*3/uL (ref 4.0–10.5)
nRBC: 0 % (ref 0.0–0.2)

## 2022-07-09 LAB — BASIC METABOLIC PANEL
Anion gap: 10 (ref 5–15)
BUN: 9 mg/dL (ref 6–20)
CO2: 27 mmol/L (ref 22–32)
Calcium: 9 mg/dL (ref 8.9–10.3)
Chloride: 101 mmol/L (ref 98–111)
Creatinine, Ser: 0.97 mg/dL (ref 0.61–1.24)
GFR, Estimated: >60 mL/min (ref >60)
Glucose, Bld: 138 mg/dL — ABNORMAL HIGH (ref 70–99)
Potassium: 3.5 mmol/L (ref 3.5–5.1)
Sodium: 138 mmol/L (ref 135–145)

## 2022-07-09 LAB — RESP PANEL BY RT-PCR (RSV, FLU A&B, COVID)  RVPGX2
Influenza A by PCR: NEGATIVE
Influenza B by PCR: NEGATIVE
Resp Syncytial Virus by PCR: NEGATIVE
SARS Coronavirus 2 by RT PCR: NEGATIVE

## 2022-07-09 NOTE — ED Triage Notes (Signed)
Patient reports persistent productive cough with chest congestion for several weeks , treated with Paxlovid for Covid with no improvement , denies fever or chills .

## 2022-07-09 NOTE — ED Provider Triage Note (Signed)
Emergency Medicine Provider Triage Evaluation Note  Jared Barnett , a 42 y.o. male  was evaluated in triage.  Pt complains of persistent and cough for the past 2 weeks.  Patient states he tested positive for COVID 3 days ago and has been unable to sleep due to his cough.  Patient was given Paxlovid and has been taking that along with cough drops which have not relieved symptoms.  Patient states still able to take in food and fluids orally.  He states he is not coughing up any mucus.  Patient denied fevers, loss of consciousness, dysuria, abdominal pain  Review of Systems  Positive: HPI Negative: See HPI  Physical Exam  BP (!) 148/97   Pulse 97   Temp 98.9 F (37.2 C) (Oral)   Resp 20   SpO2 98%  Gen:   Awake, no distress   Resp:  Normal effort  MSK:   Moves extremities without difficulty  Other:  Lungs clear to auscultation bilaterally  Medical Decision Making  Medically screening exam initiated at 9:22 PM.  Appropriate orders placed.  Jacelyn Grip was informed that the remainder of the evaluation will be completed by another provider, this initial triage assessment does not replace that evaluation, and the importance of remaining in the ED until their evaluation is complete.  Workup initiated, patient stable at this time   Elvina Sidle 07/09/22 2127

## 2022-07-10 MED ORDER — AZITHROMYCIN 250 MG PO TABS: 250.0000 mg | ORAL_TABLET | Freq: Every day | ORAL | 0 refills | Status: DC

## 2022-07-10 MED ORDER — DEXAMETHASONE 4 MG PO TABS
8.0000 mg | ORAL_TABLET | Freq: Once | ORAL | Status: AC
  Administered 2022-07-10: 8 mg via ORAL
  Filled 2022-07-10: qty 2

## 2022-07-10 MED ORDER — ALBUTEROL SULFATE HFA 108 (90 BASE) MCG/ACT IN AERS
2.0000 | INHALATION_SPRAY | Freq: Once | RESPIRATORY_TRACT | Status: AC
  Administered 2022-07-10: 2 via RESPIRATORY_TRACT
  Filled 2022-07-10: qty 6.7

## 2022-07-10 NOTE — ED Provider Notes (Signed)
[facility] Provider Note   CSN: [id number] Arrival date & time: 07/09/22  2042     History  Chief Complaint  Patient presents with   Cough    Jared Barnett is a 42 y.o. male.  The history is provided by the patient.      Patient is an otherwise healthy 42 year old male presents for cough. He reports that started over a week ago while he was [place] in [place]. it is worsening.  He reports green sputum, no hemoptysis.  He reports chest pain with cough He reports chills.  He has sore throat.  He reports fatigue. He was seen at his PCP and had a positive COVID test was started on Paxlovid.  His symptoms have not improved he is a non-smoker.  No history of asthma  Home Medications Prior to Admission medications   Medication Sig Start Date End Date Taking? Authorizing Provider  azithromycin (ZITHROMAX) 250 MG tablet Take 1 tablet (250 mg total) by mouth daily. Take first 2 tablets together, then 1 every day until finished. 07/10/22  Yes Ripley Fraise, MD  dextromethorphan-guaiFENesin [facility] DM) 30-600 MG 12hr tablet Take 1 tablet by mouth 2 (two) times daily as needed for cough.   Yes [provider]  pseudoephedrine (SUDAFED) 120 MG 12 hr tablet Take 120 mg by mouth every 12 (twelve) hours as needed for congestion.   Yes [provider]      Allergies    Patient has no known allergies.    Review of Systems   Review of Systems  Physical Exam Updated Vital Signs BP (!) 151/92 (BP Location: Right Arm)   Pulse 73   Temp 99 F (37.2 C) (Oral)   Resp 18   SpO2 99%  Physical Exam CONSTITUTIONAL: Well developed/well nourished HEAD: Normocephalic/atraumatic EYES: EOMI/PERRL ENMT: Mucous membranes moist, uvula midline, no erythema or exudate NECK: supple no meningeal signs SPINE/BACK:entire spine nontender CV: S1/S2 noted, no murmurs/rubs/gallops noted LUNGS: Lungs are clear to auscultation bilaterally,  no apparent distress ABDOMEN: soft, nontender, no rebound or guarding, bowel sounds noted throughout abdomen GU:no cva tenderness NEURO: Pt is awake/alert/appropriate, moves all extremitiesx4.  No facial droop.   EXTREMITIES: pulses normal/equal, full ROM SKIN: warm, color normal PSYCH: no abnormalities of mood noted, alert and oriented to situation  ED Results / Procedures / Treatments   Labs (all labs ordered are listed, but only abnormal results are displayed) Labs Reviewed  BASIC METABOLIC PANEL - Abnormal; Notable for the following components:      Result Value   Glucose, Bld 138 (*)    All other components within normal limits  RESP PANEL BY RT-PCR (RSV, FLU A&B, COVID)  [id number]  CBC WITH DIFFERENTIAL/PLATELET    EKG None  Radiology DG Chest 2 View  Result Date: 07/09/2022 CLINICAL DATA:  Cough with chest pain and shortness of breath. EXAM: CHEST - 2 VIEW COMPARISON:  March 26, 2017 FINDINGS: The heart size and mediastinal contours are within normal limits. Both lungs are clear. The visualized skeletal structures are unremarkable. IMPRESSION: No active cardiopulmonary disease. Electronically Signed   By: Virgina Norfolk M.D.   On: 07/09/2022 22:05    Procedures Procedures    Medications Ordered in ED Medications  albuterol (VENTOLIN HFA) 108 (90 Base) MCG/ACT inhaler 2 puff (2 puffs Inhalation Given 07/10/22 0150)  dexamethasone (DECADRON) tablet 8 mg (8 mg Oral Given 07/10/22 0222)    ED Course/ Medical Decision Making/ A&P  Medical Decision Making Risk Prescription drug management.   Patient well-appearing, no acute distress. I have visualized his x-ray is negative.  Viral panel negative.  Suspect he had a false positive previously.  He plans to start Paxlovid However given his recent travel, , Will add on azithromycin.  Decadron and albuterol given for comfort.  Patient safe for discharge home       Final Clinical  Impression(s) / ED Diagnoses Final diagnoses:  Acute bronchitis, unspecified organism    Rx / DC Orders ED Discharge Orders          Ordered    azithromycin (ZITHROMAX) 250 MG tablet  Daily        07/10/22 0154              Ripley Fraise, MD 07/10/22 [phone]

## 2022-07-18 ENCOUNTER — Observation Stay (HOSPITAL_BASED_OUTPATIENT_CLINIC_OR_DEPARTMENT_OTHER)
Admission: EM | Admit: 2022-07-18 | Discharge: 2022-07-19 | Disposition: A | Discharge status: Home / Self Care | Payer: Managed Care, Other (non HMO) | Attending: Surgery | Admitting: Surgery

## 2022-07-18 ENCOUNTER — Emergency Department (HOSPITAL_BASED_OUTPATIENT_CLINIC_OR_DEPARTMENT_OTHER): Payer: Managed Care, Other (non HMO)

## 2022-07-18 ENCOUNTER — Observation Stay (HOSPITAL_COMMUNITY): Payer: Managed Care, Other (non HMO) | Admitting: Anesthesiology

## 2022-07-18 ENCOUNTER — Encounter (HOSPITAL_BASED_OUTPATIENT_CLINIC_OR_DEPARTMENT_OTHER): Payer: Self-pay

## 2022-07-18 ENCOUNTER — Other Ambulatory Visit: Payer: Self-pay

## 2022-07-18 ENCOUNTER — Encounter (HOSPITAL_COMMUNITY): Admission: EM | Disposition: A | Discharge status: Home / Self Care | Payer: Self-pay | Source: Home / Self Care

## 2022-07-18 DIAGNOSIS — K8012 Calculus of gallbladder with acute and chronic cholecystitis without obstruction: Principal | ICD-10-CM | POA: Insufficient documentation

## 2022-07-18 DIAGNOSIS — R1011 Right upper quadrant pain: Secondary | ICD-10-CM | POA: Diagnosis present

## 2022-07-18 DIAGNOSIS — K819 Cholecystitis, unspecified: Secondary | ICD-10-CM | POA: Diagnosis present

## 2022-07-18 DIAGNOSIS — Z79899 Other long term (current) drug therapy: Secondary | ICD-10-CM | POA: Diagnosis not present

## 2022-07-18 DIAGNOSIS — R112 Nausea with vomiting, unspecified: Secondary | ICD-10-CM | POA: Insufficient documentation

## 2022-07-18 DIAGNOSIS — K8000 Calculus of gallbladder with acute cholecystitis without obstruction: Secondary | ICD-10-CM | POA: Diagnosis present

## 2022-07-18 DIAGNOSIS — R1010 Upper abdominal pain, unspecified: Principal | ICD-10-CM

## 2022-07-18 DIAGNOSIS — I10 Essential (primary) hypertension: Secondary | ICD-10-CM | POA: Diagnosis not present

## 2022-07-18 DIAGNOSIS — K810 Acute cholecystitis: Secondary | ICD-10-CM

## 2022-07-18 DIAGNOSIS — R739 Hyperglycemia, unspecified: Secondary | ICD-10-CM | POA: Insufficient documentation

## 2022-07-18 DIAGNOSIS — E7800 Pure hypercholesterolemia, unspecified: Secondary | ICD-10-CM | POA: Insufficient documentation

## 2022-07-18 HISTORY — PX: CHOLECYSTECTOMY: SHX55

## 2022-07-18 LAB — COMPREHENSIVE METABOLIC PANEL
ALT: 42 U/L (ref 0–44)
AST: 31 U/L (ref 15–41)
Albumin: 4.1 g/dL (ref 3.5–5.0)
Alkaline Phosphatase: 44 U/L (ref 38–126)
Anion gap: 11 (ref 5–15)
BUN: 9 mg/dL (ref 6–20)
CO2: 26 mmol/L (ref 22–32)
Calcium: 9.7 mg/dL (ref 8.9–10.3)
Chloride: 97 mmol/L — ABNORMAL LOW (ref 98–111)
Creatinine, Ser: 0.87 mg/dL (ref 0.61–1.24)
GFR, Estimated: >60 mL/min (ref >60)
Glucose, Bld: 149 mg/dL — ABNORMAL HIGH (ref 70–99)
Potassium: 3.8 mmol/L (ref 3.5–5.1)
Sodium: 134 mmol/L — ABNORMAL LOW (ref 135–145)
Total Bilirubin: 0.5 mg/dL (ref 0.3–1.2)
Total Protein: 8.8 g/dL — ABNORMAL HIGH (ref 6.5–8.1)

## 2022-07-18 LAB — CBC
HCT: 44.9 % (ref 39.0–52.0)
HCT: 45.8 % (ref 39.0–52.0)
Hemoglobin: 15.3 g/dL (ref 13.0–17.0)
Hemoglobin: 15.3 g/dL (ref 13.0–17.0)
MCH: 30.8 pg (ref 26.0–34.0)
MCH: 30.8 pg (ref 26.0–34.0)
MCHC: 34.1 g/dL (ref 30.0–36.0)
MCHC: 33.4 g/dL (ref 30.0–36.0)
MCV: 90.5 fL (ref 80.0–100.0)
MCV: 92.3 fL (ref 80.0–100.0)
Platelets: 369 10*3/uL (ref 150–400)
Platelets: 370 10*3/uL (ref 150–400)
RBC: 4.96 MIL/uL (ref 4.22–5.81)
RBC: 4.96 MIL/uL (ref 4.22–5.81)
RDW: 12.7 % (ref 11.5–15.5)
RDW: 12.6 % (ref 11.5–15.5)
WBC: 16.7 10*3/uL — ABNORMAL HIGH (ref 4.0–10.5)
WBC: 16.6 10*3/uL — ABNORMAL HIGH (ref 4.0–10.5)
nRBC: 0 % (ref 0.0–0.2)
nRBC: 0 % (ref 0.0–0.2)

## 2022-07-18 LAB — CREATININE, SERUM
Creatinine, Ser: 0.76 mg/dL (ref 0.61–1.24)
GFR, Estimated: >60 mL/min (ref >60)

## 2022-07-18 LAB — LIPASE, BLOOD: Lipase: 27 U/L (ref 11–51)

## 2022-07-18 LAB — HIV ANTIBODY (ROUTINE TESTING W REFLEX): HIV Screen 4th Generation wRfx: NONREACTIVE

## 2022-07-18 SURGERY — LAPAROSCOPIC CHOLECYSTECTOMY WITH INTRAOPERATIVE CHOLANGIOGRAM
Anesthesia: Regional

## 2022-07-18 MED ORDER — MIDAZOLAM HCL 2 MG/2ML IJ SOLN
INTRAMUSCULAR | Status: AC
  Filled 2022-07-18: qty 2

## 2022-07-18 MED ORDER — ONDANSETRON 4 MG PO TBDP: 4.0000 mg | ORAL_TABLET | Freq: Four times a day (QID) | ORAL | Status: DC | PRN

## 2022-07-18 MED ORDER — HYDROMORPHONE HCL 1 MG/ML IJ SOLN
1.0000 mg | Freq: Once | INTRAMUSCULAR | Status: AC
  Administered 2022-07-18: 1 mg via INTRAVENOUS
  Filled 2022-07-18: qty 1

## 2022-07-18 MED ORDER — ENOXAPARIN SODIUM 40 MG/0.4ML IJ SOSY: 40.0000 mg | PREFILLED_SYRINGE | INTRAMUSCULAR | Status: DC

## 2022-07-18 MED ORDER — PROPOFOL 10 MG/ML IV BOLUS
INTRAVENOUS | Status: AC
  Filled 2022-07-18: qty 20

## 2022-07-18 MED ORDER — BUPIVACAINE-EPINEPHRINE (PF) 0.25% -1:200000 IJ SOLN
INTRAMUSCULAR | Status: AC
  Filled 2022-07-18: qty 30

## 2022-07-18 MED ORDER — ORAL CARE MOUTH RINSE: 15.0000 mL | Freq: Once | OROMUCOSAL | Status: AC

## 2022-07-18 MED ORDER — METOPROLOL TARTRATE 5 MG/5ML IV SOLN
5.0000 mg | Freq: Four times a day (QID) | INTRAVENOUS | Status: DC | PRN
  Administered 2022-07-18: 5 mg via INTRAVENOUS
  Filled 2022-07-18: qty 5

## 2022-07-18 MED ORDER — ENOXAPARIN SODIUM 40 MG/0.4ML IJ SOSY
40.0000 mg | PREFILLED_SYRINGE | INTRAMUSCULAR | Status: DC
  Administered 2022-07-19: 40 mg via SUBCUTANEOUS
  Filled 2022-07-18: qty 0.4

## 2022-07-18 MED ORDER — FAMOTIDINE IN NACL 20-0.9 MG/50ML-% IV SOLN
20.0000 mg | Freq: Once | INTRAVENOUS | Status: AC
  Administered 2022-07-18: 20 mg via INTRAVENOUS
  Filled 2022-07-18: qty 50

## 2022-07-18 MED ORDER — FENTANYL CITRATE PF 50 MCG/ML IJ SOSY
50.0000 ug | PREFILLED_SYRINGE | INTRAMUSCULAR | Status: DC
  Administered 2022-07-18: 100 ug via INTRAVENOUS
  Filled 2022-07-18: qty 2

## 2022-07-18 MED ORDER — LABETALOL HCL 5 MG/ML IV SOLN
INTRAVENOUS | Status: DC | PRN
  Administered 2022-07-18 (×2): 5 mg via INTRAVENOUS

## 2022-07-18 MED ORDER — INDOCYANINE GREEN 25 MG IV SOLR
1.2500 mg | Freq: Once | INTRAVENOUS | Status: AC
  Administered 2022-07-18: 1.25 mg via INTRAVENOUS

## 2022-07-18 MED ORDER — OXYCODONE HCL 5 MG PO TABS
5.0000 mg | ORAL_TABLET | ORAL | Status: DC | PRN
  Administered 2022-07-18 – 2022-07-19 (×2): 5 mg via ORAL
  Filled 2022-07-18 (×2): qty 1

## 2022-07-18 MED ORDER — FENTANYL CITRATE PF 50 MCG/ML IJ SOSY
25.0000 ug | PREFILLED_SYRINGE | INTRAMUSCULAR | Status: DC | PRN
  Administered 2022-07-18 (×2): 50 ug via INTRAVENOUS

## 2022-07-18 MED ORDER — SODIUM CHLORIDE 0.9 % IV BOLUS
1000.0000 mL | Freq: Once | INTRAVENOUS | Status: AC
  Administered 2022-07-18: 1000 mL via INTRAVENOUS

## 2022-07-18 MED ORDER — FENTANYL CITRATE (PF) 100 MCG/2ML IJ SOLN
INTRAMUSCULAR | Status: AC
  Filled 2022-07-18: qty 2

## 2022-07-18 MED ORDER — PIPERACILLIN-TAZOBACTAM 3.375 G IVPB 30 MIN
3.3750 g | Freq: Once | INTRAVENOUS | Status: AC
  Administered 2022-07-18: 3.375 g via INTRAVENOUS
  Filled 2022-07-18: qty 50

## 2022-07-18 MED ORDER — KCL IN DEXTROSE-NACL 20-5-0.45 MEQ/L-%-% IV SOLN
INTRAVENOUS | Status: DC
  Filled 2022-07-18: qty 1000

## 2022-07-18 MED ORDER — POLYETHYLENE GLYCOL 3350 17 G PO PACK: 17.0000 g | PACK | Freq: Every day | ORAL | Status: DC | PRN

## 2022-07-18 MED ORDER — CHLORHEXIDINE GLUCONATE 0.12 % MT SOLN
15.0000 mL | Freq: Once | OROMUCOSAL | Status: AC
  Administered 2022-07-18: 15 mL via OROMUCOSAL

## 2022-07-18 MED ORDER — OXYCODONE HCL 5 MG PO TABS: 5.0000 mg | ORAL_TABLET | Freq: Four times a day (QID) | ORAL | Status: DC | PRN

## 2022-07-18 MED ORDER — DEXAMETHASONE SODIUM PHOSPHATE 10 MG/ML IJ SOLN
INTRAMUSCULAR | Status: DC | PRN
  Administered 2022-07-18: 10 mg via INTRAVENOUS

## 2022-07-18 MED ORDER — 0.9 % SODIUM CHLORIDE (POUR BTL) OPTIME
TOPICAL | Status: DC | PRN
  Administered 2022-07-18: 1000 mL

## 2022-07-18 MED ORDER — HEMOSTATIC AGENTS (NO CHARGE) OPTIME
TOPICAL | Status: DC | PRN
  Administered 2022-07-18: 2 via TOPICAL

## 2022-07-18 MED ORDER — MORPHINE SULFATE (PF) 2 MG/ML IV SOLN
2.0000 mg | INTRAVENOUS | Status: DC | PRN
  Administered 2022-07-18 – 2022-07-19 (×5): 2 mg via INTRAVENOUS
  Filled 2022-07-18 (×6): qty 1

## 2022-07-18 MED ORDER — ACETAMINOPHEN 500 MG PO TABS
1000.0000 mg | ORAL_TABLET | Freq: Once | ORAL | Status: AC
  Administered 2022-07-18: 1000 mg via ORAL
  Filled 2022-07-18: qty 2

## 2022-07-18 MED ORDER — LACTATED RINGERS IV BOLUS
1000.0000 mL | Freq: Once | INTRAVENOUS | Status: AC
  Administered 2022-07-18: 1000 mL via INTRAVENOUS

## 2022-07-18 MED ORDER — DEXAMETHASONE SODIUM PHOSPHATE 10 MG/ML IJ SOLN
INTRAMUSCULAR | Status: AC
  Filled 2022-07-18: qty 1

## 2022-07-18 MED ORDER — SUGAMMADEX SODIUM 200 MG/2ML IV SOLN
INTRAVENOUS | Status: DC | PRN
  Administered 2022-07-18: 200 mg via INTRAVENOUS

## 2022-07-18 MED ORDER — HYDRALAZINE HCL 20 MG/ML IJ SOLN
5.0000 mg | INTRAMUSCULAR | Status: DC | PRN
  Administered 2022-07-18 (×2): 5 mg via INTRAVENOUS
  Filled 2022-07-18 (×3): qty 1

## 2022-07-18 MED ORDER — ALBUTEROL SULFATE HFA 108 (90 BASE) MCG/ACT IN AERS
INHALATION_SPRAY | RESPIRATORY_TRACT | Status: AC
  Filled 2022-07-18: qty 6.7

## 2022-07-18 MED ORDER — BUPIVACAINE-EPINEPHRINE 0.25% -1:200000 IJ SOLN
INTRAMUSCULAR | Status: DC | PRN
  Administered 2022-07-18: 16 mL

## 2022-07-18 MED ORDER — LACTATED RINGERS IV SOLN: INTRAVENOUS | Status: DC

## 2022-07-18 MED ORDER — ACETAMINOPHEN 325 MG PO TABS: 650.0000 mg | ORAL_TABLET | Freq: Four times a day (QID) | ORAL | Status: DC | PRN

## 2022-07-18 MED ORDER — LABETALOL HCL 5 MG/ML IV SOLN
INTRAVENOUS | Status: AC
  Filled 2022-07-18: qty 4

## 2022-07-18 MED ORDER — ROCURONIUM BROMIDE 10 MG/ML (PF) SYRINGE
PREFILLED_SYRINGE | INTRAVENOUS | Status: DC | PRN
  Administered 2022-07-18: 100 mg via INTRAVENOUS

## 2022-07-18 MED ORDER — ACETAMINOPHEN 650 MG RE SUPP: 650.0000 mg | Freq: Four times a day (QID) | RECTAL | Status: DC | PRN

## 2022-07-18 MED ORDER — ROCURONIUM BROMIDE 10 MG/ML (PF) SYRINGE
PREFILLED_SYRINGE | INTRAVENOUS | Status: AC
  Filled 2022-07-18: qty 10

## 2022-07-18 MED ORDER — ACETAMINOPHEN 500 MG PO TABS
1000.0000 mg | ORAL_TABLET | Freq: Three times a day (TID) | ORAL | Status: DC
  Administered 2022-07-18 – 2022-07-19 (×2): 1000 mg via ORAL
  Filled 2022-07-18 (×3): qty 2

## 2022-07-18 MED ORDER — SODIUM CHLORIDE 0.9 % IV SOLN
2.0000 g | INTRAVENOUS | Status: DC
  Administered 2022-07-18: 2 g via INTRAVENOUS
  Filled 2022-07-18: qty 20

## 2022-07-18 MED ORDER — MIDAZOLAM HCL 5 MG/5ML IJ SOLN
INTRAMUSCULAR | Status: DC | PRN
  Administered 2022-07-18: .5 mg via INTRAVENOUS

## 2022-07-18 MED ORDER — ONDANSETRON HCL 4 MG/2ML IJ SOLN
INTRAMUSCULAR | Status: DC | PRN
  Administered 2022-07-18: 4 mg via INTRAVENOUS

## 2022-07-18 MED ORDER — LISINOPRIL 10 MG PO TABS
10.0000 mg | ORAL_TABLET | Freq: Every day | ORAL | Status: DC
  Administered 2022-07-18 – 2022-07-19 (×2): 10 mg via ORAL
  Filled 2022-07-18 (×2): qty 1

## 2022-07-18 MED ORDER — LACTATED RINGERS IR SOLN
Status: DC | PRN
  Administered 2022-07-18: 1000 mL

## 2022-07-18 MED ORDER — FENTANYL CITRATE PF 50 MCG/ML IJ SOSY
PREFILLED_SYRINGE | INTRAMUSCULAR | Status: AC
  Filled 2022-07-18: qty 2

## 2022-07-18 MED ORDER — ONDANSETRON HCL 4 MG/2ML IJ SOLN
INTRAMUSCULAR | Status: AC
  Filled 2022-07-18: qty 2

## 2022-07-18 MED ORDER — PROPOFOL 10 MG/ML IV BOLUS
INTRAVENOUS | Status: DC | PRN
  Administered 2022-07-18: 200 mg via INTRAVENOUS
  Administered 2022-07-18: 50 mg via INTRAVENOUS

## 2022-07-18 MED ORDER — BUPIVACAINE LIPOSOME 1.3 % IJ SUSP
INTRAMUSCULAR | Status: DC | PRN
  Administered 2022-07-18 (×2): 5 mL via PERINEURAL

## 2022-07-18 MED ORDER — METHOCARBAMOL 500 MG PO TABS
500.0000 mg | ORAL_TABLET | Freq: Four times a day (QID) | ORAL | Status: DC | PRN
  Administered 2022-07-18 – 2022-07-19 (×2): 500 mg via ORAL
  Filled 2022-07-18 (×2): qty 1

## 2022-07-18 MED ORDER — BUPIVACAINE HCL (PF) 0.5 % IJ SOLN
INTRAMUSCULAR | Status: DC | PRN
  Administered 2022-07-18 (×2): 25 mL via PERINEURAL

## 2022-07-18 MED ORDER — ALBUTEROL SULFATE HFA 108 (90 BASE) MCG/ACT IN AERS
INHALATION_SPRAY | RESPIRATORY_TRACT | Status: DC | PRN
  Administered 2022-07-18: 2 via RESPIRATORY_TRACT
  Administered 2022-07-18: 6 via RESPIRATORY_TRACT

## 2022-07-18 MED ORDER — LABETALOL HCL 5 MG/ML IV SOLN
10.0000 mg | Freq: Once | INTRAVENOUS | Status: AC
  Administered 2022-07-18: 10 mg via INTRAVENOUS

## 2022-07-18 MED ORDER — MIDAZOLAM HCL 2 MG/2ML IJ SOLN
1.0000 mg | INTRAMUSCULAR | Status: DC
  Administered 2022-07-18: 2 mg via INTRAVENOUS
  Filled 2022-07-18: qty 2

## 2022-07-18 MED ORDER — ONDANSETRON HCL 4 MG/2ML IJ SOLN
4.0000 mg | Freq: Once | INTRAMUSCULAR | Status: AC
  Administered 2022-07-18: 4 mg via INTRAVENOUS
  Filled 2022-07-18: qty 2

## 2022-07-18 MED ORDER — FENTANYL CITRATE (PF) 100 MCG/2ML IJ SOLN
INTRAMUSCULAR | Status: DC | PRN
  Administered 2022-07-18: 100 ug via INTRAVENOUS

## 2022-07-18 MED ORDER — ONDANSETRON HCL 4 MG/2ML IJ SOLN
4.0000 mg | Freq: Four times a day (QID) | INTRAMUSCULAR | Status: DC | PRN
  Administered 2022-07-18 – 2022-07-19 (×3): 4 mg via INTRAVENOUS
  Filled 2022-07-18 (×3): qty 2

## 2022-07-18 SURGICAL SUPPLY — 44 items
APL SKNCLS STERI-STRIP NONHPOA (GAUZE/BANDAGES/DRESSINGS)
APPLIER CLIP 5 13 M/L LIGAMAX5 (MISCELLANEOUS) ×1
APPLIER CLIP ROT 10 11.4 M/L (STAPLE)
BAG COUNTER SPONGE SURGICOUNT (BAG) IMPLANT
BAG SPEC RTRVL 10 TROC 200 (ENDOMECHANICALS) ×1
BENZOIN TINCTURE PRP APPL 2/3 (GAUZE/BANDAGES/DRESSINGS) IMPLANT
CABLE HIGH FREQUENCY MONO STRZ (ELECTRODE) ×1 IMPLANT
CHLORAPREP W/TINT 26 (MISCELLANEOUS) ×1 IMPLANT
CLIP APPLIE 5 13 M/L LIGAMAX5 (MISCELLANEOUS) ×1 IMPLANT
CLIP APPLIE ROT 10 11.4 M/L (STAPLE) IMPLANT
COVER MAYO STAND XLG (MISCELLANEOUS) ×1 IMPLANT
COVER SURGICAL LIGHT HANDLE (MISCELLANEOUS) ×1 IMPLANT
DERMABOND ADVANCED .7 DNX12 (GAUZE/BANDAGES/DRESSINGS) IMPLANT
DRAPE C-ARM 42X120 X-RAY (DRAPES) IMPLANT
ELECT REM PT RETURN 15FT ADLT (MISCELLANEOUS) ×1 IMPLANT
GLOVE BIO SURGEON STRL SZ7 (GLOVE) ×1 IMPLANT
GLOVE BIOGEL PI IND STRL 7.5 (GLOVE) ×1 IMPLANT
GOWN STRL REUS W/ TWL LRG LVL3 (GOWN DISPOSABLE) ×1 IMPLANT
GOWN STRL REUS W/TWL LRG LVL3 (GOWN DISPOSABLE) ×1
GRASPER SUT TROCAR 14GX15 (MISCELLANEOUS) IMPLANT
HEMOSTAT SNOW SURGICEL 2X4 (HEMOSTASIS) IMPLANT
IRRIG SUCT STRYKERFLOW 2 WTIP (MISCELLANEOUS) ×1
IRRIGATION SUCT STRKRFLW 2 WTP (MISCELLANEOUS) ×1 IMPLANT
KIT BASIN OR (CUSTOM PROCEDURE TRAY) ×1 IMPLANT
KIT TURNOVER KIT A (KITS) IMPLANT
PENCIL SMOKE EVACUATOR (MISCELLANEOUS) IMPLANT
POUCH RETRIEVAL ECOSAC 10 (ENDOMECHANICALS) ×1 IMPLANT
POUCH RETRIEVAL ECOSAC 10MM (ENDOMECHANICALS) ×1
PROTECTOR NERVE ULNAR (MISCELLANEOUS) IMPLANT
SCISSORS LAP 5X35 DISP (ENDOMECHANICALS) ×1 IMPLANT
SET CHOLANGIOGRAPH MIX (MISCELLANEOUS) IMPLANT
SET TUBE SMOKE EVAC HIGH FLOW (TUBING) ×1 IMPLANT
SLEEVE Z-THREAD 5X100MM (TROCAR) ×2 IMPLANT
SPIKE FLUID TRANSFER (MISCELLANEOUS) ×1 IMPLANT
STRIP CLOSURE SKIN 1/2X4 (GAUZE/BANDAGES/DRESSINGS) IMPLANT
SUT MNCRL AB 4-0 PS2 18 (SUTURE) ×1 IMPLANT
SUT VICRYL 0 TIES 12 18 (SUTURE) IMPLANT
SUT VICRYL 0 UR6 27IN ABS (SUTURE) IMPLANT
TOWEL OR 17X26 10 PK STRL BLUE (TOWEL DISPOSABLE) ×1 IMPLANT
TOWEL OR NON WOVEN STRL DISP B (DISPOSABLE) ×1 IMPLANT
TRAY LAPAROSCOPIC (CUSTOM PROCEDURE TRAY) ×1 IMPLANT
TROCAR 11X100 Z THREAD (TROCAR) IMPLANT
TROCAR BALLN 12MMX100 BLUNT (TROCAR) ×1 IMPLANT
TROCAR Z-THREAD OPTICAL 5X100M (TROCAR) ×1 IMPLANT

## 2022-07-18 NOTE — ED Triage Notes (Signed)
Generalized upper abdominal pain ongoing all day. Estimates 10 episodes of vomiting last time being several hours ago.

## 2022-07-18 NOTE — Op Note (Signed)
Preoperative diagnosis: Acute cholecystitis Postoperative diagnosis: Same as above Procedure: Laparoscopic cholecystectomy Surgeon: Dr. Serita Grammes Assistant: Barkley Boards, PA-C Anesthesia: General with bilateral tap blocks Estimated blood loss: Minimal Specimens: Gallbladder and contents to pathology Complications: None Drains: None Sponge and count was correct at completion Disposition recovery stimulation  Indications: This a 42 year old male with right upper quadrant pain and an elevated white blood cell count.  He has an ultrasound that is consistent with calculus cholecystitis.  We discussed proceeding with a laparoscopic cholecystectomy.  Procedure: After informed consent was obtained he was taken to the operating room.  He was already on antibiotics.  SCDs were placed.  He had undergone bilateral tap blocks with anesthesia.  He was then placed under general anesthesia without complication.  He was prepped and draped in standard sterile surgical fashion.  Surgical timeout was then performed.  I treated Marcaine close umbilicus.  I made a vertical incision.  I carried this to the fascia.  I incised the fascia sharply and entered his peritoneum bluntly.  This was done without injury.  I placed a 0 Vicryl pursestring suture through the fascia.  I then inserted a Hassan trocar and insufflated the abdomen to 15 mmHg pressure.  I placed 3 additional 5 mm trocars in epigastric and right upper quadrant under direct vision without complication.  I then noted his gallbladder to have acute cholecystitis.  We are unable to grasp it initially.  I aspirated some bile.  He was then retracted cephalad.  He had a lot of omentum adherent to it as well as just some fat this was then removed I was able to retract it lateral.  I then was able to with some difficulty dissect in the triangle and then identify all the structures and obtain a critical view of safety.  I used the green dye and noted that the  common bile duct was clearly well away from the field.  I then clipped the artery 3 times and divided it leaving 2 clips in place.  I then treated the cystic duct similarly.  These clips completely traverse the duct and the duct was viable.  I then remove the gallbladder from the liver bed with some difficulty due to the very thick wall.  I then placed in a retrieval bag and removed from the abdomen.  Hemostasis was obtained in the gallbladder bed.  It was very friable and his liver is fatty so I did place some Surgicel snow in the bed.  I then removed the [facility] trocar.  At time I pursestring down.  I placed an additional 0 Vicryl suture using the suture passer device to completely better at this defect.  The remaining trocars were removed the abdomen was desufflated.  These were closed with 4 Monocryl glue.  He tolerated this well was extubated transferred recovery stable.

## 2022-07-18 NOTE — ED Notes (Signed)
Report called to Crystal, nurse on accepting unit, no further questions at this time.

## 2022-07-18 NOTE — Anesthesia Procedure Notes (Signed)
Procedure Name: Intubation Date/Time: 07/18/2022 10:38 AM  Performed by: Lind Covert, CRNAPre-anesthesia Checklist: Emergency Drugs available, Suction available, Patient being monitored, Timeout performed and Patient identified Patient Re-evaluated:Patient Re-evaluated prior to induction Oxygen Delivery Method: Circle system utilized Preoxygenation: Pre-oxygenation with 100% oxygen Induction Type: IV induction Ventilation: Two handed mask ventilation required Laryngoscope Size: Glidescope and 4 Grade View: Grade I Tube type: Oral Tube size: 7.5 mm Number of attempts: 2 Airway Equipment and Method: Rigid stylet Placement Confirmation: ETT inserted through vocal cords under direct vision, positive ETCO2 and breath sounds checked- equal and bilateral Secured at: 24 cm Tube secured with: Tape Dental Injury: Teeth and Oropharynx as per pre-operative assessment  Comments: Attempt x1 with MAC 4 by A. Erick Blinks. Unable to visualize cords. Intubation with glidescope successful.

## 2022-07-18 NOTE — TOC CM/SW Note (Signed)
Transition of Care [facility]) Screening Note  Patient Details  Name: Jared Barnett Date of Birth: 04-04-1981  Transition of [facility]) CM/SW Contact:    Sherie Don, LCSW Phone Number: 07/18/2022, 8:41 AM  Transition of Care Department [facility]) has reviewed patient and no TOC needs have been identified at this time. We will continue to monitor patient advancement through interdisciplinary progression rounds. If new patient transition needs arise, please place a TOC consult.

## 2022-07-18 NOTE — Anesthesia Preprocedure Evaluation (Addendum)
Anesthesia Evaluation  Patient identified by MRN, date of birth, ID band Patient awake    Reviewed: Allergy & Precautions, NPO status , Patient's Chart, lab work & pertinent test results  Airway Mallampati: II  TM Distance: >3 FB Neck ROM: Full    Dental no notable dental hx. (+) Teeth Intact, Dental Advisory Given   Pulmonary neg pulmonary ROS   Pulmonary exam normal breath sounds clear to auscultation       Cardiovascular hypertension (undiagnosed, BP 180/100s in preop), Normal cardiovascular exam Rhythm:Regular Rate:Normal     Neuro/Psych negative neurological ROS  negative psych ROS   GI/Hepatic negative GI ROS, Neg liver ROS,,,  Endo/Other  negative endocrine ROS    Renal/GU negative Renal ROS  negative genitourinary   Musculoskeletal negative musculoskeletal ROS (+)    Abdominal   Peds  Hematology negative hematology ROS (+)   Anesthesia Other Findings   Reproductive/Obstetrics                             Anesthesia Physical Anesthesia Plan  ASA: 3  Anesthesia Plan: General and Regional   Post-op Pain Management: Regional block* and Tylenol PO (pre-op)*   Induction: Intravenous  PONV Risk Score and Plan: 2 and Midazolam, Dexamethasone and Ondansetron  Airway Management Planned: Oral ETT  Additional Equipment:   Intra-op Plan:   Post-operative Plan: Extubation in OR  Informed Consent: I have reviewed the patients History and Physical, chart, labs and discussed the procedure including the risks, benefits and alternatives for the proposed anesthesia with the patient or authorized representative who has indicated his/her understanding and acceptance.     Dental advisory given  Plan Discussed with: CRNA  Anesthesia Plan Comments:        Anesthesia Quick Evaluation

## 2022-07-18 NOTE — Discharge Instructions (Signed)
[facility] LAPAROSCOPIC SURGERY: POST OP INSTRUCTIONS Always review your discharge instruction sheet given to you by the facility where your surgery was performed. IF YOU HAVE DISABILITY OR FAMILY LEAVE FORMS, YOU MUST BRING THEM TO THE OFFICE FOR PROCESSING.   DO NOT GIVE THEM TO YOUR DOCTOR.  PAIN CONTROL  First take acetaminophen (Tylenol) AND/or ibuprofen (Advil) to control your pain after surgery.  Follow directions on package.  Taking acetaminophen (Tylenol) and/or ibuprofen (Advil) regularly after surgery will help to control your pain and lower the amount of prescription pain medication you may need.  You should not take more than 3,000 mg (3 grams) of acetaminophen (Tylenol) in 24 hours.  You should not take ibuprofen (Advil), aleve, motrin, naprosyn or other NSAIDS if you have a history of stomach ulcers or chronic kidney disease.  A prescription for pain medication may be given to you upon discharge.  Take your pain medication as prescribed, if you still have uncontrolled pain after taking acetaminophen (Tylenol) or ibuprofen (Advil). Use ice packs to help control pain. If you need a refill on your pain medication, please contact your pharmacy.  They will contact our office to request authorization. Prescriptions will not be filled after 5pm or on week-ends.  HOME MEDICATIONS Take your usually prescribed medications unless otherwise directed.  DIET You should follow a light diet the first few days after arrival home.  Be sure to include lots of fluids daily. Avoid fatty, fried foods.   CONSTIPATION It is common to experience some constipation after surgery and if you are taking pain medication.  Increasing fluid intake and taking a stool softener (such as Colace) will usually help or prevent this problem from occurring.  A mild laxative (Milk of Magnesia or Miralax) should be taken according to package instructions if there are no bowel movements after 48  hours.  WOUND/INCISION CARE Most patients will experience some swelling and bruising in the area of the incisions.  Ice packs will help.  Swelling and bruising can take several days to resolve.  Unless discharge instructions indicate otherwise, follow guidelines below  STERI-STRIPS - you may remove your outer bandages 48 hours after surgery, and you may shower at that time.  You have steri-strips (small skin tapes) in place directly over the incision.  These strips should be left on the skin for 7-10 days.   DERMABOND/SKIN GLUE - you may shower in 24 hours.  The glue will flake off over the next 2-3 weeks. Any sutures or staples will be removed at the office during your follow-up visit.  ACTIVITIES You may resume regular (light) daily activities beginning the next day--such as daily self-care, walking, climbing stairs--gradually increasing activities as tolerated.  You may have sexual intercourse when it is comfortable.  Refrain from any heavy lifting or straining until approved by your doctor. You may drive when you are no longer taking prescription pain medication, you can comfortably wear a seatbelt, and you can safely maneuver your car and apply brakes.  FOLLOW-UP You should see your doctor in the office for a follow-up appointment approximately 2-3 weeks after your surgery.  You should have been given your post-op/follow-up appointment when your surgery was scheduled.  If you did not receive a post-op/follow-up appointment, make sure that you call for this appointment within a day or two after you arrive home to insure a convenient appointment time.   WHEN TO CALL YOUR DOCTOR: Fever over 101.0 Inability to urinate Continued bleeding from incision.   Increased pain, redness, or drainage from the incision. Increasing abdominal pain  The clinic staff is available to answer your questions during regular business hours.  Please don't hesitate to call and ask to speak to one of the nurses for  clinical concerns.  If you have a medical emergency, go to the nearest emergency room or call 911.  A surgeon from [facility] is always on call at the hospital. [address], [address], [place], [place]  [address] ? [address] [address], [place], [place]   [address] [phone] ? [phone] ? FAX [phone] Web site: [email or web address]      Managing Your Pain After Surgery Without Opioids    Thank you for participating in our program to help patients manage their pain after surgery without opioids. This is part of our effort to provide you with the best care possible, without exposing you or your family to the risk that opioids pose.  What pain can I expect after surgery? You can expect to have some pain after surgery. This is normal. The pain is typically worse the day after surgery, and quickly begins to get better. Many studies have found that many patients are able to manage their pain after surgery with Over-the-Counter (OTC) medications such as Tylenol and Motrin. If you have a condition that does not allow you to take Tylenol or Motrin, notify your surgical team.  How will I manage my pain? The best strategy for controlling your pain after surgery is around the clock pain control with Tylenol (acetaminophen) and Motrin (ibuprofen or Advil). Alternating these medications with each other allows you to maximize your pain control. In addition to Tylenol and Motrin, you can use heating pads or ice packs on your incisions to help reduce your pain.  How will I alternate your regular strength over-the-counter pain medication? You will take a dose of pain medication every three hours. Start by taking 650 mg of Tylenol (2 pills of 325 mg) 3 hours later take 600 mg of Motrin (3 pills of 200 mg) 3 hours after taking the Motrin take 650 mg of Tylenol 3 hours after that take 600 mg of Motrin.   - 1 -  See example - if your first dose of Tylenol is at 12:00  PM   12:00 PM Tylenol 650 mg (2 pills of 325 mg)  3:00 PM Motrin 600 mg (3 pills of 200 mg)  6:00 PM Tylenol 650 mg (2 pills of 325 mg)  9:00 PM Motrin 600 mg (3 pills of 200 mg)  Continue alternating every 3 hours   We recommend that you follow this schedule around-the-clock for at least 3 days after surgery, or until you feel that it is no longer needed. Use the table on the last page of this handout to keep track of the medications you are taking. Important: Do not take more than 3000mg of Tylenol or 3200mg of Motrin in a 24-hour period. Do not take ibuprofen/Motrin if you have a history of bleeding stomach ulcers, severe kidney disease, &/or actively taking a blood thinner  What if I still have pain? If you have pain that is not controlled with the over-the-counter pain medications (Tylenol and Motrin or Advil) you might have what we call "breakthrough" pain. You will receive a prescription for a small amount of an opioid pain medication such as Oxycodone, Tramadol, or Tylenol with Codeine. Use these opioid pills in the first 24 hours after surgery if you have breakthrough pain. Do   not take more than 1 pill every 4-6 hours.  If you still have uncontrolled pain after using all opioid pills, don't hesitate to call our staff using the number provided. We will help make sure you are managing your pain in the best way possible, and if necessary, we can provide a prescription for additional pain medication.   Day 1    Time  Name of Medication Number of pills taken  Amount of Acetaminophen  Pain Level   Comments  AM PM       AM PM       AM PM       AM PM       AM PM       AM PM       AM PM       AM PM       Total Daily amount of Acetaminophen Do not take more than  3,000 mg per day      Day 2    Time  Name of Medication Number of pills taken  Amount of Acetaminophen  Pain Level   Comments  AM PM       AM PM       AM PM       AM PM       AM PM       AM PM       AM  PM       AM PM       Total Daily amount of Acetaminophen Do not take more than  3,000 mg per day      Day 3    Time  Name of Medication Number of pills taken  Amount of Acetaminophen  Pain Level   Comments  AM PM       AM PM       AM PM       AM PM          AM PM       AM PM       AM PM       AM PM       Total Daily amount of Acetaminophen Do not take more than  3,000 mg per day      Day 4    Time  Name of Medication Number of pills taken  Amount of Acetaminophen  Pain Level   Comments  AM PM       AM PM       AM PM       AM PM       AM PM       AM PM       AM PM       AM PM       Total Daily amount of Acetaminophen Do not take more than  3,000 mg per day      Day 5    Time  Name of Medication Number of pills taken  Amount of Acetaminophen  Pain Level   Comments  AM PM       AM PM       AM PM       AM PM       AM PM       AM PM       AM PM       AM PM       Total Daily amount of Acetaminophen Do not take more than    3,000 mg per day       Day 6    Time  Name of Medication Number of pills taken  Amount of Acetaminophen  Pain Level  Comments  AM PM       AM PM       AM PM       AM PM       AM PM       AM PM       AM PM       AM PM       Total Daily amount of Acetaminophen Do not take more than  3,000 mg per day      Day 7    Time  Name of Medication Number of pills taken  Amount of Acetaminophen  Pain Level   Comments  AM PM       AM PM       AM PM       AM PM       AM PM       AM PM       AM PM       AM PM       Total Daily amount of Acetaminophen Do not take more than  3,000 mg per day        For additional information about how and where to safely dispose of unused opioid medications - https://www.morepowerfulnc.org  Disclaimer: This document contains information and/or instructional materials adapted from [facility] for the typical patient with your condition. It does not replace medical advice  from your health care provider because your experience may differ from that of the typical patient. Talk to your health care provider if you have any questions about this document, your condition or your treatment plan. Adapted from [facility]  

## 2022-07-18 NOTE — ED Provider Notes (Signed)
[facility] Emergency Department Provider Note MRN:  [id number]  [place] date & time: 07/18/22     Chief Complaint   Abdominal Pain   History of Present Illness   Jared Barnett is a 42 y.o. year-old male with no pertinent medical history presenting to the ED with chief complaint of abdominal pain.  Upper abdominal pain all day today with 10+ episodes of nausea and vomiting.  No diarrhea.  No fever.  No chest pain or shortness of breath, no lower abdominal pain.  No history of abdominal surgeries.  Review of Systems  A thorough review of systems was obtained and all systems are negative except as noted in the HPI and PMH.   Patient's Health History   History reviewed. No pertinent past medical history.  Past Surgical History:  Procedure Laterality Date   ANTERIOR CRUCIATE LIGAMENT REPAIR      Family History  Problem Relation Age of Onset   Hypertension Mother    Cancer Father     Social History   Socioeconomic History   Marital status: Single    Spouse name: Not on file   Number of children: Not on file   Years of education: Not on file   Highest education level: Not on file  Occupational History   Not on file  Tobacco Use   Smoking status: Never   Smokeless tobacco: Never  Vaping Use   Vaping Use: Never used  Substance and Sexual Activity   Alcohol use: Yes    Comment: Social   Drug use: Not Currently   Sexual activity: Not on file  Other Topics Concern   Not on file  Social History Narrative   Not on file   Social Determinants of Health   Financial Resource Strain: Not on file  Food Insecurity: Not on file  Transportation Needs: Not on file  Physical Activity: Not on file  Stress: Not on file  Social Connections: Not on file  Intimate Partner Violence: Not on file     Physical Exam   Vitals:   07/18/22 0215 07/18/22 0230  BP: (!) 146/81 (!) 142/101  Pulse: 79 75  Resp:  18  Temp:    SpO2: 96% 99%    CONSTITUTIONAL:  Well-appearing, NAD NEURO/PSYCH:  Alert and oriented x 3, no focal deficits EYES:  eyes equal and reactive ENT/NECK:  no LAD, no JVD CARDIO: Regular rate, well-perfused, normal S1 and S2 PULM:  CTAB no wheezing or rhonchi GI/GU: Moderate epigastric and right upper quadrant tenderness to palpation MSK/SPINE:  No gross deformities, no edema SKIN:  no rash, atraumatic   *Additional and/or pertinent findings included in MDM below  Diagnostic and Interventional Summary    EKG Interpretation  Date/Time:    Ventricular Rate:    PR Interval:    QRS Duration:   QT Interval:    QTC Calculation:   R Axis:     Text Interpretation:         Labs Reviewed  CBC - Abnormal; Notable for the following components:      Result Value   WBC 16.7 (*)    All other components within normal limits  COMPREHENSIVE METABOLIC PANEL - Abnormal; Notable for the following components:   Sodium 134 (*)    Chloride 97 (*)    Glucose, Bld 149 (*)    Total Protein 8.8 (*)    All other components within normal limits  LIPASE, BLOOD    US Abdomen Limited RUQ (LIVER/GB)  Final Result      Medications  piperacillin-tazobactam (ZOSYN) IVPB 3.375 g (3.375 g Intravenous New Bag/Given 07/18/22 0224)  lactated ringers bolus 1,000 mL (has no administration in time range)  HYDROmorphone (DILAUDID) injection 1 mg (has no administration in time range)  sodium chloride 0.9 % bolus 1,000 mL (1,000 mLs Intravenous New Bag/Given 07/18/22 0139)  ondansetron (ZOFRAN) injection 4 mg (4 mg Intravenous Given 07/18/22 0140)  HYDROmorphone (DILAUDID) injection 1 mg (1 mg Intravenous Given 07/18/22 0139)  famotidine (PEPCID) IVPB 20 mg premix (0 mg Intravenous Stopped 07/18/22 0207)     Procedures  /  Critical Care .Critical Care  Performed by: Maudie Flakes, MD Authorized by: Maudie Flakes, MD   Critical care provider statement:    Critical care time (minutes):  40   Critical care was necessary to treat or prevent  imminent or life-threatening deterioration of the following conditions: acute cholecystitis.   Critical care was time spent personally by me on the following activities:  Development of treatment plan with patient or surrogate, discussions with consultants, evaluation of patient's response to treatment, examination of patient, ordering and review of laboratory studies, ordering and review of radiographic studies, ordering and performing treatments and interventions, pulse oximetry, re-evaluation of patient's condition and review of old charts   ED Course and Medical Decision Making  Initial Impression and Ddx Differential diagnosis includes gastritis, pancreatitis, biliary colic, cholecystitis  Past medical/surgical history that increases complexity of ED encounter: None  Interpretation of Diagnostics I personally reviewed the laboratory assessment and my interpretation is as follows: Leukocytosis noted, otherwise no significant blood count or electrolyte disturbance  Ultrasound with thickened gallbladder wall, pericholecystic fluid, and mobile stone lodged in the gallbladder neck.  All concerning for acute cholecystitis.  Patient Reassessment and Ultimate Disposition/Management     Patient with continued pain despite Dilaudid.  Providing with further fluids, Zosyn, will consult general surgery for admission and likely OR management.  Patient management required discussion with the following services or consulting groups:  General/Trauma Surgery  Complexity of Problems Addressed Acute illness or injury that poses threat of life of bodily function  Additional Data Reviewed and Analyzed Further history obtained from: Further history from spouse/family member  Additional Factors Impacting ED Encounter Risk Use of parenteral controlled substances and Consideration of hospitalization  Barth Kirks. [place]onia Small, [place] [email or web address]  Final Clinical Impressions(s) / ED Diagnoses     ICD-10-CM   1. Pain of upper abdomen  R10.10     2. Nausea and vomiting, unspecified vomiting type  R11.2     3. Acute cholecystitis  K81.0       ED Discharge Orders     None        Discharge Instructions Discussed with and Provided to Patient:   Discharge Instructions   None      Maudie Flakes, MD 07/18/22 [phone]

## 2022-07-18 NOTE — Anesthesia Procedure Notes (Addendum)
Anesthesia Regional Block: Quadratus lumborum   Pre-Anesthetic Checklist: , timeout performed,  Correct Patient, Correct Site, Correct Laterality,  Correct Procedure, Correct Position, site marked,  Risks and benefits discussed,  Pre-op evaluation,  At surgeon's request and post-op pain management  Laterality: Left  Prep: Maximum Sterile Barrier Precautions used, chloraprep       Needles:  Injection technique: Single-shot  Needle Type: Echogenic Stimulator Needle     Needle Length: 9cm  Needle Gauge: 21     Additional Needles:   Procedures:,,,, ultrasound used (permanent image in chart),,    Narrative:  Start time: 07/18/2022 9:58 AM End time: 07/18/2022 10:01 AM Injection made incrementally with aspirations every 5 mL. Anesthesiologist: Freddrick March, MD

## 2022-07-18 NOTE — Consult Note (Signed)
Inpatient Consultation Note   Patient: Jared Barnett [id number] DOB: Apr 03, 1981 PCP: Iona Beard, MD DOA: 07/18/2022 DOS: the patient was seen and examined on 07/18/2022 Primary service: Edison Pace Md, MD  Referring physician: Donne Hazel Reason for consult: HTN  Assessment/Plan: Assessment and Plan: This is a pleasant 42 year old gentleman with a history of obesity who was admitted to the hospital by surgical service for acute cholecystitis and underwent laparoscopic cholecystectomy this afternoon.  Perioperative course complicated by hypertension, with blood pressure as high as 186/108, controlled with as needed medication.  Hospitalist was contacted due to patient not having primary care provider, for blood pressure control and recommendations.  On discussion with the patient, he is actually seen at the [facility] clinic for his primary care, and his primary care physician is Dr. Iona Beard, MD. -The patient has uncontrolled hypertension currently, probably due to postoperative pain and discomfort -Based on what he tells me he probably has stage I or even stage II hypertension at baseline -Since he has required several as needed doses, will start the patient on low-dose lisinopril 10 mg -Continue as needed hydralazine for SBP over 180 -Will check fasting lipids in the morning, as well as hemoglobin A1c -Recommend the patient be discharged on an antihypertensive agent when otherwise ready, to follow-up with his primary care provider for further adjustment  TRH will follow until discharge, will manage blood pressure and adjust antihypertensive dosing.  HPI: Jared Barnett is a 42 y.o. male with no past medical history who was admitted by the surgery service this morning after elective laparoscopic cholecystectomy, which proceeded without complication.  Prior to surgery, the patient was noted to be quite hypertensive, and has remained so postoperatively.  He is required multiple doses of IV labetalol and  metoprolol to control his blood pressure.  Patient was seen postop in his room on the third floor, he is currently experiencing some severe abdominal pain, and associated nausea.  However he is not in acute distress.  His abdomen is soft and appropriately distended.  He tells me that he only checks his blood pressure intermittently, usually when he is sick.  Few weeks ago he had COVID, at that time his blood pressure was in the 150s.  He says that he does have a primary care provider, and when checked in the office, his blood pressure usually in the 140s.  He is not on any prescription medication.  He is not sure, but he thinks he does get regular lab work like fasting lipids, and hemoglobin A1c.  On review of his chart, his blood pressure has been as high as 186/108.  Currently blood pressure is 155/107 after receiving a dose of IV metoprolol.  Review of Systems: Patient denies any chest pain, he has some nausea but no vomiting, he endorses postop abdominal pain.  Systems was done, and otherwise negative.  Complete review History reviewed. No pertinent past medical history. Past Surgical History:  Procedure Laterality Date   ANTERIOR CRUCIATE LIGAMENT REPAIR     Social History:  reports that he has never smoked. He has never used smokeless tobacco. He reports current alcohol use. He reports that he does not currently use drugs.  No Known Allergies  Family History  Problem Relation Age of Onset   Hypertension Mother    Cancer Father     Prior to Admission medications   Medication Sig Start Date End Date Taking? Authorizing Provider  azithromycin (ZITHROMAX) 250 MG tablet Take 1 tablet (250 mg total) by mouth  daily. Take first 2 tablets together, then 1 every day until finished. Patient not taking: Reported on 07/18/2022 07/10/22   Ripley Fraise, MD    Physical Exam: Vitals:   07/18/22 1230 07/18/22 1240 07/18/22 1259 07/18/22 1416  BP: (!) 156/102 (!) 150/95 (!) 175/110 (!) 155/107   Pulse: 86 81 81 78  Resp: 19 19 19 19   Temp:   99.3 F (37.4 C) 99.1 F (37.3 C)  TempSrc:   Oral Oral  SpO2: 92% 95% 93% 96%  Weight:      Height:      General:  Alert, oriented, calm, in no acute distress  Eyes: EOMI, clear conjuctivae, white sclerea Neck: supple, no masses, trachea mildline  Cardiovascular: RRR, no murmurs or rubs, no peripheral edema  Respiratory: clear to auscultation bilaterally, no wheezes, no crackles  Abdomen: soft, slightly tender, distended postoperatively, with no bowel tones are currently skin: dry, no rashes  Musculoskeletal: no joint effusions, normal range of motion  Psychiatric: appropriate affect, normal speech  Neurologic: extraocular muscles intact, clear speech, moving all extremities with intact sensorium  Data Reviewed:  Vital signs reviewed, as well as lab work which are notable only for hypertension as mentioned above, and mild leukocytosis.  Electrolytes and renal function unremarkable.  Primary team communication: Recommendations secure chatted to surgical PA. Thank you very much for involving [place] in the care of your patient.  Author: Crissa Sowder Neva Seat, MD 07/18/2022 2:33 PM  For on call review [email or web address].

## 2022-07-18 NOTE — Transfer of Care (Signed)
Immediate Anesthesia Transfer of Care Note  Patient: Jared Barnett  Procedure(s) Performed: LAPAROSCOPIC CHOLECYSTECTOMY WITH ICG  Patient Location: PACU  Anesthesia Type:General  Level of Consciousness: sedated  Airway & Oxygen Therapy: Patient Spontanous Breathing and Patient connected to face mask oxygen  Post-op Assessment: Report given to RN and Post -op Vital signs reviewed and stable  Post vital signs: Reviewed and stable  Last Vitals:  Vitals Value Taken Time  BP 168/120 07/18/22 1201  Temp    Pulse 96 07/18/22 1202  Resp 29 07/18/22 1203  SpO2 98 % 07/18/22 1202  Vitals shown include unvalidated device data.  Last Pain:  Vitals:   07/18/22 1010  TempSrc:   PainSc: Asleep      Patients Stated Pain Goal: 2 (0000000 [id number])  Complications: No notable events documented.

## 2022-07-18 NOTE — Anesthesia Procedure Notes (Signed)
Anesthesia Regional Block: Quadratus lumborum   Pre-Anesthetic Checklist: , timeout performed,  Correct Patient, Correct Site, Correct Laterality,  Correct Procedure, Correct Position, site marked,  Risks and benefits discussed,  Pre-op evaluation,  At surgeon's request and post-op pain management  Laterality: Right  Prep: Maximum Sterile Barrier Precautions used, chloraprep       Needles:  Injection technique: Single-shot  Needle Type: Echogenic Stimulator Needle     Needle Length: 9cm  Needle Gauge: 21     Additional Needles:   Procedures:,,,, ultrasound used (permanent image in chart),,    Narrative:  Start time: 07/18/2022 10:01 AM End time: 07/18/2022 10:05 AM Injection made incrementally with aspirations every 5 mL. Anesthesiologist: Freddrick March, MD

## 2022-07-18 NOTE — H&P (Signed)
Admission Note  Jared Barnett [birth year]  [id number].    Requesting MD: Gerlene Fee, MD Chief Complaint/Reason for Consult: Cholecystitis  HPI:  Patient is an otherwise healthy 42 year old male who presented to the ED with acute RUQ pain that started yesterday AM. Pain is constant and has progressively worsened and radiates to chest. Associated nausea with bilious emesis. Denies fever, chills, urinary symptoms, diarrhea. No prior abdominal surgery. NKDA. Does not take any daily medications. Denies tobacco or illicit drug use. Drinks alcohol occasionally but not daily.   ROS: Negative other than HPI  Family History  Problem Relation Age of Onset   Hypertension Mother    Cancer Father     History reviewed. No pertinent past medical history.  Past Surgical History:  Procedure Laterality Date   ANTERIOR CRUCIATE LIGAMENT REPAIR      Social History:  reports that he has never smoked. He has never used smokeless tobacco. He reports current alcohol use. He reports that he does not currently use drugs.  Allergies: No Known Allergies  Medications Prior to Admission  Medication Sig Dispense Refill   azithromycin (ZITHROMAX) 250 MG tablet Take 1 tablet (250 mg total) by mouth daily. Take first 2 tablets together, then 1 every day until finished. (Patient not taking: Reported on 07/18/2022) 6 tablet 0    Blood pressure (!) 186/108, pulse 73, temperature 98.8 F (37.1 C), temperature source Oral, resp. rate 20, height 6\' 2"  (1.88 m), weight 117.9 kg, SpO2 98 %. Physical Exam:  General: pleasant, WD, WN male who is laying in bed in NAD HEENT: head is normocephalic, atraumatic.  Sclera are anicteric.  Ears and nose without any masses or lesions.  Mouth is pink and moist Heart: regular, rate, and rhythm.  Normal s1,s2. No obvious murmurs, gallops, or rubs noted.  Palpable radial and pedal pulses bilaterally Lungs: CTAB, no wheezes, rhonchi, or rales noted.  Respiratory effort  nonlabored Abd: soft, very ttp in RUQ, ND, +BS, no masses, hernias, or organomegaly MS: all 4 extremities are symmetrical with no cyanosis, clubbing, or edema. Skin: warm and dry with no masses, lesions, or rashes Neuro: Cranial nerves 2-12 grossly intact, sensation is normal throughout Psych: A&Ox3 with an appropriate affect.   Results for orders placed or performed during the hospital encounter of 07/18/22 (from the past 48 hour(s))  CBC     Status: Abnormal   Collection Time: 07/18/22  1:24 AM  Result Value Ref Range   WBC 16.7 (H) 4.0 - 10.5 K/uL   RBC 4.96 4.22 - 5.81 MIL/uL   Hemoglobin 15.3 13.0 - 17.0 g/dL   HCT 44.9 39.0 - 52.0 %   MCV 90.5 80.0 - 100.0 fL   MCH 30.8 26.0 - 34.0 pg   MCHC 34.1 30.0 - 36.0 g/dL   RDW 12.7 11.5 - 15.5 %   Platelets 369 150 - 400 K/uL   nRBC 0.0 0.0 - 0.2 %    Comment: Performed at [facility], [address], [place], [place] [address]  Comprehensive metabolic panel     Status: Abnormal   Collection Time: 07/18/22  1:24 AM  Result Value Ref Range   Sodium 134 (L) 135 - 145 mmol/L   Potassium 3.8 3.5 - 5.1 mmol/L   Chloride 97 (L) 98 - 111 mmol/L   CO2 26 22 - 32 mmol/L   Glucose, Bld 149 (H) 70 - 99 mg/dL    Comment: Glucose reference range applies only  to samples taken after fasting for at least 8 hours.   BUN 9 6 - 20 mg/dL   Creatinine, Ser 0.87 0.61 - 1.24 mg/dL   Calcium 9.7 8.9 - 10.3 mg/dL   Total Protein 8.8 (H) 6.5 - 8.1 g/dL   Albumin 4.1 3.5 - 5.0 g/dL   AST 31 15 - 41 U/L   ALT 42 0 - 44 U/L   Alkaline Phosphatase 44 38 - 126 U/L   Total Bilirubin 0.5 0.3 - 1.2 mg/dL   GFR, Estimated >60 >60 mL/min    Comment: (NOTE) Calculated using the CKD-EPI Creatinine Equation (2021)    Anion gap 11 5 - 15    Comment: Performed at [facility], [place], [place] [address]  Lipase, blood     Status: None   Collection Time: 07/18/22  1:24 AM  Result Value Ref Range   Lipase  27 11 - 51 U/L    Comment: Performed at [facility], [address], [place], [place] [address]  CBC     Status: Abnormal   Collection Time: 07/18/22  7:17 AM  Result Value Ref Range   WBC 16.6 (H) 4.0 - 10.5 K/uL   RBC 4.96 4.22 - 5.81 MIL/uL   Hemoglobin 15.3 13.0 - 17.0 g/dL   HCT 45.8 39.0 - 52.0 %   MCV 92.3 80.0 - 100.0 fL   MCH 30.8 26.0 - 34.0 pg   MCHC 33.4 30.0 - 36.0 g/dL   RDW 12.6 11.5 - 15.5 %   Platelets 370 150 - 400 K/uL   nRBC 0.0 0.0 - 0.2 %    Comment: Performed at [facility], [place] [address] [place], [place] [address]   US Abdomen Limited RUQ (LIVER/GB)  Result Date: 07/18/2022 CLINICAL DATA:  Right upper quadrant pain EXAM: ULTRASOUND ABDOMEN LIMITED RIGHT UPPER QUADRANT COMPARISON:  None Available. FINDINGS: Gallbladder: Gallbladder is partially distended. A gallstone is noted in the gallbladder neck which is non mobile. Increased wall thickening to 8.6 mm is noted. A portion of this is related to incomplete distension. Mild pericholecystic fluid is noted. Murphy's sign cannot be elicited due to underlying medications. Common bile duct: Diameter: 0.2 mm. Liver: No focal lesion identified. Within normal limits in parenchymal echogenicity. Portal vein is patent on color Doppler imaging with normal direction of blood flow towards the liver. Other: None. IMPRESSION: Findings suspicious for acute cholecystitis secondary to a gallstone lodged in the gallbladder neck. Electronically Signed   By: Inez Catalina M.D.   On: 07/18/2022 02:06      Assessment/Plan Acute cholecystitis  - RUQ [place] with gallstone lodged in gallbladder neck - WBC 16, LFTs WNL - exam and hx consistent with above - I have explained the procedure, risks, and aftercare of Laparoscopic cholecystectomy with ICG.  Risks include but are not limited to anesthesia (MI, CVA, death, prolonged intubation and aspiration), bleeding, infection, wound problems, hernia, bile  leak, injury to common bile duct/liver/intestine, possible need for subtotal cholecystectomy or open cholecystectomy, increased risk of DVT/PE and diarrhea post op.   He seems to understand and agrees to proceed.   FEN: NPO, IVF VTE: LMWH ID: [email or web address]   I reviewed ED provider notes, last 24 h vitals and pain scores, last 48 h intake and output, last 24 h labs and trends, and last 24 h imaging results.   Norm Parcel, [facility] Surgery 07/18/2022, 7:47 AM Please see Amion for pager number during day  hours 7:00am-4:30pm

## 2022-07-19 ENCOUNTER — Encounter (HOSPITAL_COMMUNITY): Payer: Self-pay | Admitting: General Surgery

## 2022-07-19 DIAGNOSIS — R739 Hyperglycemia, unspecified: Secondary | ICD-10-CM | POA: Diagnosis not present

## 2022-07-19 DIAGNOSIS — E7800 Pure hypercholesterolemia, unspecified: Secondary | ICD-10-CM | POA: Insufficient documentation

## 2022-07-19 DIAGNOSIS — I10 Essential (primary) hypertension: Secondary | ICD-10-CM

## 2022-07-19 LAB — CBC
HCT: 43.4 % (ref 39.0–52.0)
Hemoglobin: 14.4 g/dL (ref 13.0–17.0)
MCH: 30.9 pg (ref 26.0–34.0)
MCHC: 33.2 g/dL (ref 30.0–36.0)
MCV: 93.1 fL (ref 80.0–100.0)
Platelets: 352 10*3/uL (ref 150–400)
RBC: 4.66 MIL/uL (ref 4.22–5.81)
RDW: 13.2 % (ref 11.5–15.5)
WBC: 13.6 10*3/uL — ABNORMAL HIGH (ref 4.0–10.5)
nRBC: 0 % (ref 0.0–0.2)

## 2022-07-19 LAB — COMPREHENSIVE METABOLIC PANEL
ALT: 55 U/L — ABNORMAL HIGH (ref 0–44)
AST: 42 U/L — ABNORMAL HIGH (ref 15–41)
Albumin: 4.1 g/dL (ref 3.5–5.0)
Alkaline Phosphatase: 45 U/L (ref 38–126)
Anion gap: 9 (ref 5–15)
BUN: 11 mg/dL (ref 6–20)
CO2: 27 mmol/L (ref 22–32)
Calcium: 8.7 mg/dL — ABNORMAL LOW (ref 8.9–10.3)
Chloride: 99 mmol/L (ref 98–111)
Creatinine, Ser: 0.9 mg/dL (ref 0.61–1.24)
GFR, Estimated: >60 mL/min (ref >60)
Glucose, Bld: 109 mg/dL — ABNORMAL HIGH (ref 70–99)
Potassium: 3.5 mmol/L (ref 3.5–5.1)
Sodium: 135 mmol/L (ref 135–145)
Total Bilirubin: 0.8 mg/dL (ref 0.3–1.2)
Total Protein: 8.2 g/dL — ABNORMAL HIGH (ref 6.5–8.1)

## 2022-07-19 LAB — LIPID PANEL
Cholesterol: 217 mg/dL — ABNORMAL HIGH (ref 0–200)
HDL: 71 mg/dL (ref >40)
LDL Cholesterol: 133 mg/dL — ABNORMAL HIGH (ref 0–99)
Total CHOL/HDL Ratio: 3.1 RATIO
Triglycerides: 67 mg/dL (ref <150)
VLDL: 13 mg/dL (ref 0–40)

## 2022-07-19 LAB — SURGICAL PATHOLOGY

## 2022-07-19 MED ORDER — IBUPROFEN 200 MG PO TABS: 400.0000 mg | ORAL_TABLET | Freq: Four times a day (QID) | ORAL | Status: AC | PRN

## 2022-07-19 MED ORDER — OXYCODONE HCL 5 MG PO TABS: 5.0000 mg | ORAL_TABLET | ORAL | 0 refills | Status: AC | PRN

## 2022-07-19 MED ORDER — IBUPROFEN 400 MG PO TABS
400.0000 mg | ORAL_TABLET | Freq: Four times a day (QID) | ORAL | Status: DC
  Administered 2022-07-19: 400 mg via ORAL
  Filled 2022-07-19: qty 1

## 2022-07-19 MED ORDER — LISINOPRIL 10 MG PO TABS: 10.0000 mg | ORAL_TABLET | Freq: Every day | ORAL | 2 refills | Status: DC

## 2022-07-19 MED ORDER — ACETAMINOPHEN 500 MG PO TABS: 1000.0000 mg | ORAL_TABLET | Freq: Three times a day (TID) | ORAL | Status: AC | PRN

## 2022-07-19 NOTE — Anesthesia Postprocedure Evaluation (Signed)
Anesthesia Post Note  Patient: Jared Barnett  Procedure(s) Performed: LAPAROSCOPIC CHOLECYSTECTOMY WITH ICG     Patient location during evaluation: PACU Anesthesia Type: Regional and General Level of consciousness: awake and alert Pain management: pain level controlled Vital Signs Assessment: post-procedure vital signs reviewed and stable Respiratory status: spontaneous breathing, nonlabored ventilation, respiratory function stable and patient connected to nasal cannula oxygen Cardiovascular status: blood pressure returned to baseline and stable Postop Assessment: no apparent nausea or vomiting Anesthetic complications: no  No notable events documented.  Last Vitals:  Vitals:   07/19/22 0219 07/19/22 0516  BP: (!) 157/98 (!) 146/102  Pulse: 81 90  Resp: 18 18  Temp: 37.2 C 37.4 C  SpO2: 95% 97%    Last Pain:  Vitals:   07/19/22 0516  TempSrc: Oral  PainSc:                  [name]

## 2022-07-19 NOTE — Discharge Summary (Signed)
[facility] Discharge Summary   Patient ID: Jared Barnett MRN: [id number] DOB/AGE: 26-May-1980 42 y.o.  Admit date: 07/18/2022 Discharge date: 07/19/2022  Admitting Diagnosis: Acute cholecystitis   Discharge Diagnosis Patient Active Problem List   Diagnosis Date Noted   Benign essential HTN 07/19/2022   Elevated LDL cholesterol level 07/19/2022   Hyperglycemia 07/19/2022   Cholecystitis 07/18/2022   Acute calculous cholecystitis 07/18/2022  S/p laparoscopic cholecystectomy   Consultants Internal medicine   Imaging: US Abdomen Limited RUQ (LIVER/GB)  Result Date: 07/18/2022 CLINICAL DATA:  Right upper quadrant pain EXAM: ULTRASOUND ABDOMEN LIMITED RIGHT UPPER QUADRANT COMPARISON:  None Available. FINDINGS: Gallbladder: Gallbladder is partially distended. A gallstone is noted in the gallbladder neck which is non mobile. Increased wall thickening to 8.6 mm is noted. A portion of this is related to incomplete distension. Mild pericholecystic fluid is noted. Murphy's sign cannot be elicited due to underlying medications. Common bile duct: Diameter: 0.2 mm. Liver: No focal lesion identified. Within normal limits in parenchymal echogenicity. Portal vein is patent on color Doppler imaging with normal direction of blood flow towards the liver. Other: None. IMPRESSION: Findings suspicious for acute cholecystitis secondary to a gallstone lodged in the gallbladder neck. Electronically Signed   By: Inez Catalina M.D.   On: 07/18/2022 02:06    Procedures Dr. Rolm Bookbinder (07/18/22) - Laparoscopic Cholecystectomy with ICG  Hospital Course:  Patient is a 42 year old male who presented to the ED with RUQ abdominal pain and n/v.  Workup showed acute cholecystitis.  Patient was admitted and underwent procedure listed above.  Tolerated procedure well and was transferred to the floor.  Diet was advanced as tolerated.  On POD1, the patient was voiding well, tolerating diet, ambulating well,  pain well controlled, vital signs stable, incisions c/d/i and felt stable for discharge home.  Patient will follow up in our office in 3 weeks and knows to call with questions or concerns.  He will call to confirm appointment date/time.    Internal medicine consulted for assistance with high BP with suspicion for underlying HTN.   Physical Exam: General:  Alert, NAD, pleasant, comfortable Abd:  Soft, ND, mild tenderness, incisions C/D/I  I or a member of my team have reviewed this patient in the Controlled Substance Database.   Allergies as of 07/19/2022   No Known Allergies      Medication List     TAKE these medications    acetaminophen 500 MG tablet Commonly known as: TYLENOL Take 2 tablets (1,000 mg total) by mouth every 8 (eight) hours as needed for mild pain or fever.   ibuprofen 200 MG tablet Commonly known as: ADVIL Take 2 tablets (400 mg total) by mouth every 6 (six) hours as needed for moderate pain.   lisinopril 10 MG tablet Commonly known as: ZESTRIL Take 1 tablet (10 mg total) by mouth daily. Start taking on: July 20, 2022   oxyCODONE 5 MG immediate release tablet Commonly known as: Oxy IR/ROXICODONE Take 1 tablet (5 mg total) by mouth every 4 (four) hours as needed for moderate pain.          Follow-up Information     Maczis, Carlena Hurl, [place]. Go on 08/09/2022.   Specialty: General Surgery Why: 1:45 PM, please arrive 30 min prior to appointment time. Have ID and insurance information with you. Contact information: [place] [address] [phone]         Iona Beard,  MD. Schedule an appointment as soon as possible for a visit in 2 week(s).   Specialty: Family Medicine Contact information: [address] [place] [address] [phone]                 Signed: Norm Parcel , [facility] 07/19/2022, 10:59 AM Please see Amion for pager number  during day hours 7:00am-4:30pm

## 2022-07-19 NOTE — Progress Notes (Signed)
Discharge instructions given to patient and all questions were answered.  

## 2022-07-19 NOTE — Progress Notes (Addendum)
  Progress Note   Patient: Jared Barnett [id number] DOB: [birth year] DOA: 07/18/2022     1 DOS: the patient was seen and examined on 07/19/2022   Brief hospital course: 42 year old man no significant PMH who was admitted overnight after an elective laparoscopic cholecystectomy for persistent elevated blood pressure postoperatively requiring multiple doses of IV medication.  Followed by PCP, patient reports he is typically sick when he sees PCP and blood pressure usually 140s.  Not on any medications.  Hospitalist service consulted for elevated blood pressure evaluation.  Assessment and Plan: Acute cholecystitis, status post laparoscopic cholecystectomy 3/27 Management deferred to general surgery  Elevated blood pressure, presumed diagnosis essential hypertension given history Asymptomatic.  Start monotherapy given mild elevations as an outpatient.  Discussed side effects of lisinopril including rare angioedema and cough.  Patient elected to start this medication. Start lisinopril 10 mg daily.  Prescription sent to pharmacy.  Recommended outpatient follow-up with his PCP for consideration of titration. Patient can go home from a medical standpoint.  Elevated LDL Significance unclear.  Follow-up with PCP.  No statin now.  Random hyperglycemia, mild No treatment warranted at this time.  Follow-up hemoglobin A1c, currently pending.  This can be followed up in the outpatient setting.     Subjective:  Feels fine except some abdominal pain, better when sitting up, but significant  Sees Dr. Berdine Addison, reports blood pressure around "140", typically he is ill when he visits PCP.  Not on any medications.  Physical Exam: Vitals:   07/18/22 1849 07/18/22 2140 07/19/22 0219 07/19/22 0516  BP: (!) 153/98 (!) 173/113 (!) 157/98 (!) 146/102  Pulse: 99 81 81 90  Resp: 18 18 18 18   Temp:  98.4 F (36.9 C) 99 F (37.2 C) 99.3 F (37.4 C)  TempSrc:  Oral Oral Oral  SpO2: 98% 98% 95% 97%  Weight:       Height:       Physical Exam Vitals reviewed.  Constitutional:      General: He is not in acute distress.    Appearance: He is not ill-appearing or toxic-appearing.  Cardiovascular:     Rate and Rhythm: Normal rate and regular rhythm.     Heart sounds: No murmur heard. Pulmonary:     Effort: Pulmonary effort is normal. No respiratory distress.     Breath sounds: No wheezing, rhonchi or rales.  Musculoskeletal:     Right lower leg: No edema.     Left lower leg: No edema.  Neurological:     Mental Status: He is alert.  Psychiatric:        Mood and Affect: Mood normal.        Behavior: Behavior normal.     Data Reviewed: BMP noted AST, ALT slightly up post surgery LDL 133  Family Communication: mother at bedside  Time spent: 20 minutes  Author: Murray Hodgkins, MD 07/19/2022 10:17 AM  For on call review [email or web address].

## 2022-07-19 NOTE — Hospital Course (Addendum)
42 year old man no significant PMH who was admitted overnight after an elective laparoscopic cholecystectomy for persistent elevated blood pressure postoperatively requiring multiple doses of IV medication.  Followed by PCP, patient reports he is typically sick when he sees PCP and blood pressure usually 140s.  Not on any medications.  Hospitalist service consulted for elevated blood pressure evaluation.

## 2022-07-20 LAB — HEMOGLOBIN A1C
Hgb A1c MFr Bld: 5.9 % — ABNORMAL HIGH (ref 4.8–5.6)
Mean Plasma Glucose: 123 mg/dL

## 2023-02-23 ENCOUNTER — Emergency Department (HOSPITAL_BASED_OUTPATIENT_CLINIC_OR_DEPARTMENT_OTHER)
Admission: EM | Admit: 2023-02-23 | Discharge: 2023-02-23 | Disposition: A | Discharge status: Home / Self Care | Payer: Managed Care, Other (non HMO) | Attending: Emergency Medicine | Admitting: Emergency Medicine

## 2023-02-23 ENCOUNTER — Encounter (HOSPITAL_BASED_OUTPATIENT_CLINIC_OR_DEPARTMENT_OTHER): Payer: Self-pay | Admitting: Emergency Medicine

## 2023-02-23 DIAGNOSIS — W231XXA Caught, crushed, jammed, or pinched between stationary objects, initial encounter: Secondary | ICD-10-CM | POA: Diagnosis not present

## 2023-02-23 DIAGNOSIS — S6992XA Unspecified injury of left wrist, hand and finger(s), initial encounter: Secondary | ICD-10-CM | POA: Diagnosis present

## 2023-02-23 DIAGNOSIS — S61012A Laceration without foreign body of left thumb without damage to nail, initial encounter: Secondary | ICD-10-CM | POA: Diagnosis not present

## 2023-02-23 DIAGNOSIS — Z23 Encounter for immunization: Secondary | ICD-10-CM | POA: Diagnosis not present

## 2023-02-23 MED ORDER — LIDOCAINE HCL 2 % IJ SOLN
5.0000 mL | Freq: Once | INTRAMUSCULAR | Status: AC
  Administered 2023-02-23: 100 mg
  Filled 2023-02-23: qty 20

## 2023-02-23 MED ORDER — TETANUS-DIPHTH-ACELL PERTUSSIS 5-2.5-18.5 LF-MCG/0.5 IM SUSY
0.5000 mL | PREFILLED_SYRINGE | Freq: Once | INTRAMUSCULAR | Status: AC
  Administered 2023-02-23: 0.5 mL via INTRAMUSCULAR
  Filled 2023-02-23: qty 0.5

## 2023-02-23 NOTE — Discharge Instructions (Addendum)
The stitches can come out in about 7 to 10 days.

## 2023-02-23 NOTE — ED Provider Notes (Signed)
[place] [facility] AT [facility] Provider Note   CSN: [id number] Arrival date & time: 02/23/23  1957     History  Chief Complaint  Patient presents with   Laceration    Jared Barnett is a 42 y.o. male.   Laceration Patient with laceration to left thumb.  Came from the slide of a gun while he was at the shooting range.  Unknown last tetanus.  No other injury.  Able to move thumb freely but does have some pain.    History reviewed. No pertinent past medical history.  Home Medications Prior to Admission medications   Medication Sig Start Date End Date Taking? Authorizing Provider  acetaminophen (TYLENOL) 500 MG tablet Take 2 tablets (1,000 mg total) by mouth every 8 (eight) hours as needed for mild pain or fever. 07/19/22   Juliet Rude, PA-C  ibuprofen (ADVIL) 200 MG tablet Take 2 tablets (400 mg total) by mouth every 6 (six) hours as needed for moderate pain. 07/19/22   Juliet Rude, PA-C  lisinopril (ZESTRIL) 10 MG tablet Take 1 tablet (10 mg total) by mouth daily. 07/20/22   Standley Brooking, MD  oxyCODONE (OXY IR/ROXICODONE) 5 MG immediate release tablet Take 1 tablet (5 mg total) by mouth every 4 (four) hours as needed for moderate pain. 07/19/22   Juliet Rude, PA-C      Allergies    Patient has no known allergies.    Review of Systems   Review of Systems  Physical Exam Updated Vital Signs BP 132/76   Pulse 60   Temp 98 F (36.7 C)   Resp 18   SpO2 100%  Physical Exam Vitals and nursing note reviewed.  Musculoskeletal:     Comments: Along the dorsal aspect of the left thumb proximal phalanx there is an approximately 2 and half centimeter laceration.  Thumb stable.  Good movement of IP joint and MCP joint.  Skin:    Capillary Refill: Capillary refill takes less than 2 seconds.  Neurological:     Mental Status: He is alert.     ED Results / Procedures / Treatments   Labs (all labs ordered are listed, but only abnormal  results are displayed) Labs Reviewed - No data to display  EKG None  Radiology No results found.  Procedures .Marland KitchenLaceration Repair  Date/Time: 02/23/2023 9:29 PM  Performed by: Benjiman Core, MD Authorized by: Benjiman Core, MD   Consent:    Consent obtained:  Verbal   Consent given by:  Patient   Risks discussed:  Retained foreign body, infection, pain, need for additional repair, poor cosmetic result, tendon damage, poor wound healing, nerve damage and vascular damage   Alternatives discussed:  No treatment and delayed treatment Anesthesia:    Anesthesia method:  Local infiltration   Local anesthetic:  Lidocaine 2% w/o epi Laceration details:    Location:  Finger   Finger location:  L thumb   Length (cm):  2.5 Pre-procedure details:    Preparation:  Patient was prepped and draped in usual sterile fashion Exploration:    Limited defect created (wound extended): no     Hemostasis achieved with:  Direct pressure   Wound exploration: wound explored through full range of motion and entire depth of wound visualized   Treatment:    Area cleansed with:  Saline   Amount of cleaning:  Standard   Visualized foreign bodies/material removed: no     Scar revision: no   Skin repair:  Repair method:  Sutures   Suture size:  5-0   Wound skin closure material used: vicryl rapide.   Suture technique:  Simple interrupted Approximation:    Approximation:  Close Repair type:    Repair type:  Simple Post-procedure details:    Dressing:  Sterile dressing   Procedure completion:  Tolerated well, no immediate complications     Medications Ordered in ED Medications  lidocaine (XYLOCAINE) 2 % (with pres) injection 100 mg (100 mg Infiltration Given 02/23/23 2034)  Tdap (BOOSTRIX) injection 0.5 mL (0.5 mLs Intramuscular Given 02/23/23 2032)    ED Course/ Medical Decision Making/ A&P                                 Medical Decision Making Risk Prescription drug  management.   Patient with thumb laceration from the slide of a pistol.  Wound closed with 6 stitches.  No apparent tendon injury.  No apparent bony injury.  Tetanus updated.        Final Clinical Impression(s) / ED Diagnoses Final diagnoses:  Thumb laceration, left, initial encounter    Rx / DC Orders ED Discharge Orders     None         Benjiman Core, MD 02/23/23 2201

## 2023-02-23 NOTE — ED Notes (Signed)
 RN reviewed discharge instructions with pt. Pt verbalized understanding and had no further questions. VSS upon discharge.  

## 2023-02-23 NOTE — ED Triage Notes (Signed)
Laceration to left thumb. Caught in gun while shooting at range. Happened around 5pm Unknown tetanus

## 2023-09-09 ENCOUNTER — Ambulatory Visit
Admission: EM | Admit: 2023-09-09 | Discharge: 2023-09-09 | Disposition: A | Discharge status: Home / Self Care | Attending: Family Medicine | Admitting: Family Medicine

## 2023-09-09 DIAGNOSIS — B9789 Other viral agents as the cause of diseases classified elsewhere: Secondary | ICD-10-CM

## 2023-09-09 DIAGNOSIS — I10 Essential (primary) hypertension: Secondary | ICD-10-CM | POA: Diagnosis not present

## 2023-09-09 DIAGNOSIS — B001 Herpesviral vesicular dermatitis: Secondary | ICD-10-CM

## 2023-09-09 DIAGNOSIS — J988 Other specified respiratory disorders: Secondary | ICD-10-CM | POA: Diagnosis not present

## 2023-09-09 DIAGNOSIS — Z113 Encounter for screening for infections with a predominantly sexual mode of transmission: Secondary | ICD-10-CM

## 2023-09-09 MED ORDER — LISINOPRIL 10 MG PO TABS: 10.0000 mg | ORAL_TABLET | Freq: Every day | ORAL | 0 refills | Status: AC

## 2023-09-09 MED ORDER — VALACYCLOVIR HCL 1 G PO TABS: 1000.0000 mg | ORAL_TABLET | Freq: Three times a day (TID) | ORAL | 0 refills | Status: AC

## 2023-09-09 NOTE — ED Provider Notes (Signed)
 [facility]  Note:  This document was prepared using Conservation officer, historic buildings and may include unintentional dictation errors.  MRN: [id number] DOB: 1980-10-13  Subjective:   Jared Barnett is a 43 y.o. male presenting for 3-day history of sinus congestion, drainage and coughing.  He would also like to be evaluated for a blister rash over the left corner of his upper lip.  Has a remote history of cold sores when he was a child and then in adulthood but has not had one for about 20 years.  Denies fever, headache, confusion, weakness, numbness or tingling, ear pain, chest pain, shortness of breath, wheezing, nausea, vomiting, abdominal pain, hematuria, genital rashes, dysuria, penile discharge.  He does have unprotected sex with 1 male partner.  This does include unprotected oral sex.  She is asymptomatic.  Has a history of essential hypertension but is not taking any medications now.  No current facility-administered medications for this encounter.  Current Outpatient Medications:    lisinopril  (ZESTRIL ) 10 MG tablet, Take 1 tablet (10 mg total) by mouth daily., Disp: 30 tablet, Rfl: 2   acetaminophen  (TYLENOL ) 500 MG tablet, Take 2 tablets (1,000 mg total) by mouth every 8 (eight) hours as needed for mild pain or fever., Disp: , Rfl:    ibuprofen  (ADVIL ) 200 MG tablet, Take 2 tablets (400 mg total) by mouth every 6 (six) hours as needed for moderate pain., Disp: , Rfl:    oxyCODONE  (OXY IR/ROXICODONE ) 5 MG immediate release tablet, Take 1 tablet (5 mg total) by mouth every 4 (four) hours as needed for moderate pain., Disp: 15 tablet, Rfl: 0   No Known Allergies  History reviewed. No pertinent past medical history.   Past Surgical History:  Procedure Laterality Date   ANTERIOR CRUCIATE LIGAMENT REPAIR     CHOLECYSTECTOMY N/A 07/18/2022   Procedure: LAPAROSCOPIC CHOLECYSTECTOMY WITH ICG;  Surgeon: Enid Harry, MD;  Location: [facility];  Service: General;   Laterality: N/A;    Family History  Problem Relation Age of Onset   Hypertension Mother    Cancer Father     Social History   Tobacco Use   Smoking status: Never   Smokeless tobacco: Never  Vaping Use   Vaping status: Never Used  Substance Use Topics   Alcohol use: Yes    Comment: Social   Drug use: Not Currently    ROS   Objective:   Vitals: BP (!) 169/118 (BP Location: Left Arm)   Pulse 78   Temp 98.9 F (37.2 C) (Oral)   Resp 16   SpO2 98%   BP Readings from Last 3 Encounters:  09/09/23 (!) 169/118  02/23/23 132/76  07/19/22 (!) 158/108   Physical Exam Constitutional:      General: He is not in acute distress.    Appearance: Normal appearance. He is well-developed and normal weight. He is not ill-appearing, toxic-appearing or diaphoretic.  HENT:     Head: Normocephalic and atraumatic.     Right Ear: Tympanic membrane, ear canal and external ear normal. No drainage, swelling or tenderness. No middle ear effusion. There is no impacted cerumen. Tympanic membrane is not erythematous or bulging.     Left Ear: Tympanic membrane, ear canal and external ear normal. No drainage, swelling or tenderness.  No middle ear effusion. There is no impacted cerumen. Tympanic membrane is not erythematous or bulging.     Nose: Nose normal. No congestion or rhinorrhea.     Mouth/Throat:  Mouth: Mucous membranes are moist.     Pharynx: No oropharyngeal exudate or posterior oropharyngeal erythema.   Eyes:     General: No scleral icterus.       Right eye: No discharge.        Left eye: No discharge.     Extraocular Movements: Extraocular movements intact.     Conjunctiva/sclera: Conjunctivae normal.  Cardiovascular:     Rate and Rhythm: Normal rate and regular rhythm.     Heart sounds: Normal heart sounds. No murmur heard.    No friction rub. No gallop.  Pulmonary:     Effort: Pulmonary effort is normal. No respiratory distress.     Breath sounds: Normal breath sounds.  No stridor. No wheezing, rhonchi or rales.  Musculoskeletal:     Cervical back: Normal range of motion and neck supple. No rigidity. No muscular tenderness.  Neurological:     General: No focal deficit present.     Mental Status: He is alert and oriented to person, place, and time.     Cranial Nerves: No cranial nerve deficit.     Motor: No weakness.     Coordination: Coordination normal.     Gait: Gait normal.     Comments: Negative Romberg and pronator drift.  No facial asymmetry.  Psychiatric:        Mood and Affect: Mood normal.        Behavior: Behavior normal.        Thought Content: Thought content normal.        Judgment: Judgment normal.      Assessment and Plan :   PDMP not reviewed this encounter.  1. Cold sore   2. Viral respiratory infection   3. Screen for STD (sexually transmitted disease)   4. Essential hypertension    Recommend valacyclovir  for cold sore, culture pending.  Suspect viral URI, viral syndrome.  Recommend supportive care for this.  Regarding his blood pressure, no signs of endorgan damage.  Recommend restarting lisinopril , hypertensive friendly diet.  Follow-up with PCP as soon as possible.  Counseled patient on potential for adverse effects with medications prescribed/recommended today, ER and return-to-clinic precautions discussed, patient verbalized understanding.    Adolph Hoop, PA-C 09/09/23 1055

## 2023-09-09 NOTE — ED Triage Notes (Signed)
 Patient presents cough and congestion x 3 days. Patient states he has a bump around his lip and he is concern.

## 2023-09-09 NOTE — Discharge Instructions (Addendum)
 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  Start valacyclovir  for this.  The viral culture is pending, we will update you as the result comes back to us  in a few days.  I am also managing you for a viral respiratory illness.  For sore throat or cough try using a honey-based tea. Use 3 teaspoons of honey with juice squeezed from half lemon. Place shaved pieces of ginger into 1/2-1 cup of water and warm over stove top. Then mix the ingredients and repeat every 4 hours as needed. Please take Tylenol  500mg -650mg  once every 6 hours for fevers, aches and pains. Hydrate very well with at least 2 liters (64 ounces) of water. Eat light meals such as soups (chicken and noodles, chicken wild rice, vegetable).  Do not eat any foods that you are allergic to.  Start an antihistamine like Zyrtec (10mg  daily) for postnasal drainage, sinus congestion.  You can take this together with cough syrup as needed.   Lastly, I am managing you for your blood pressure.  Restart lisinopril  for your blood pressure.   For diabetes or elevated blood sugar, please make sure you are limiting and avoiding starchy, carbohydrate foods like pasta, breads, sweet breads, pastry, rice, potatoes, desserts. These foods can elevate your blood sugar. Also, limit and avoid drinks that contain a lot of sugar such as sodas, sweet teas, fruit juices.  Drinking plain water will be much more helpful, try 64 ounces of water daily.  It is okay to flavor your water naturally by cutting cucumber, lemon, mint or lime, placing it in a picture with water and drinking it over a period of 24-48 hours as long as it remains refrigerated.  For elevated blood pressure, make sure you are monitoring salt in your diet.  Do not eat restaurant foods and limit processed foods at home. I highly recommend you prepare and cook your own foods at home.  Processed foods include things like frozen meals, pre-seasoned meats and dinners, deli meats, canned foods as these  foods contain a high amount of sodium/salt.  Make sure you are paying attention to sodium labels on foods you buy at the grocery store. Buy your spices separately such as garlic powder, onion powder, cumin, cayenne, parsley flakes so that you can avoid seasonings that contain salt. However, salt-free seasonings are available and can be used, an example is Mrs. Dash and includes a lot of different mixtures that do not contain salt.  Lastly, when cooking using oils that are healthier for you is important. This includes olive oil, avocado oil, canola oil. We have discussed a lot of foods to avoid but below is a list of foods that can be very healthy to use in your diet whether it is for diabetes, cholesterol, high blood pressure, or in general healthy eating.  Salads - kale, spinach, cabbage, spring mix, arugula Fruits - avocadoes, berries (blueberries, raspberries, blackberries), apples, oranges, pomegranate, grapefruit, kiwi Vegetables - asparagus, cauliflower, broccoli, green beans, brussel sprouts, bell peppers, beets; stay away from or limit starchy vegetables like potatoes, carrots, peas Other general foods - kidney beans, egg whites, almonds, walnuts, sunflower seeds, pumpkin seeds, fat free yogurt, almond milk, flax seeds, quinoa, oats  Meat - It is better to eat lean meats and limit your red meat including pork to once a week.  Wild caught fish, chicken breast are good options as they tend to be leaner sources of good protein. Still be mindful of the sodium labels for  the meats you buy.  DO NOT EAT ANY FOODS ON THIS LIST THAT YOU ARE ALLERGIC TO. For more specific needs, I highly recommend consulting a dietician or nutritionist but this can definitely be a good starting point.

## 2023-09-10 ENCOUNTER — Ambulatory Visit: Admission: EM | Admit: 2023-09-10 | Discharge: 2023-09-10 | Disposition: A | Discharge status: Home / Self Care

## 2023-09-10 DIAGNOSIS — B001 Herpesviral vesicular dermatitis: Secondary | ICD-10-CM

## 2023-09-10 DIAGNOSIS — R21 Rash and other nonspecific skin eruption: Secondary | ICD-10-CM

## 2023-09-10 NOTE — ED Triage Notes (Signed)
 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
